# Patient Record
Sex: Male | Born: 1955 | Race: White | Hispanic: No | Smoking: Former smoker
Health system: Southern US, Community
[De-identification: ages and names within clinical notes are randomized; demographics above are authoritative.]

## PROBLEM LIST (undated history)

## (undated) DIAGNOSIS — G4730 Sleep apnea, unspecified: Secondary | ICD-10-CM

## (undated) DIAGNOSIS — I10 Essential (primary) hypertension: Secondary | ICD-10-CM

## (undated) DIAGNOSIS — E119 Type 2 diabetes mellitus without complications: Secondary | ICD-10-CM

## (undated) DIAGNOSIS — E7800 Pure hypercholesterolemia, unspecified: Secondary | ICD-10-CM

## (undated) HISTORY — PX: HAND SURGERY: SHX662

---

## 2004-06-08 ENCOUNTER — Encounter: Admission: RE | Admit: 2004-06-08 | Discharge: 2004-06-08 | Payer: Self-pay | Admitting: *Deleted

## 2004-06-10 ENCOUNTER — Ambulatory Visit (HOSPITAL_COMMUNITY): Admission: RE | Admit: 2004-06-10 | Discharge: 2004-06-10 | Payer: Self-pay | Admitting: *Deleted

## 2004-06-10 ENCOUNTER — Ambulatory Visit (HOSPITAL_BASED_OUTPATIENT_CLINIC_OR_DEPARTMENT_OTHER): Admission: RE | Admit: 2004-06-10 | Discharge: 2004-06-10 | Payer: Self-pay | Admitting: *Deleted

## 2005-06-07 ENCOUNTER — Ambulatory Visit: Payer: Self-pay | Admitting: Family Medicine

## 2005-06-28 ENCOUNTER — Ambulatory Visit: Payer: Self-pay | Admitting: Family Medicine

## 2005-07-19 ENCOUNTER — Encounter: Admission: RE | Admit: 2005-07-19 | Discharge: 2005-08-14 | Payer: Self-pay | Admitting: Family Medicine

## 2006-03-07 ENCOUNTER — Ambulatory Visit: Payer: Self-pay | Admitting: Family Medicine

## 2006-03-21 ENCOUNTER — Ambulatory Visit: Payer: Self-pay | Admitting: Family Medicine

## 2006-05-23 DIAGNOSIS — E669 Obesity, unspecified: Secondary | ICD-10-CM | POA: Insufficient documentation

## 2006-05-23 DIAGNOSIS — F5232 Male orgasmic disorder: Secondary | ICD-10-CM | POA: Insufficient documentation

## 2006-05-23 DIAGNOSIS — E119 Type 2 diabetes mellitus without complications: Secondary | ICD-10-CM | POA: Insufficient documentation

## 2006-05-23 DIAGNOSIS — E785 Hyperlipidemia, unspecified: Secondary | ICD-10-CM | POA: Insufficient documentation

## 2006-05-23 DIAGNOSIS — I10 Essential (primary) hypertension: Secondary | ICD-10-CM | POA: Insufficient documentation

## 2006-05-23 DIAGNOSIS — L219 Seborrheic dermatitis, unspecified: Secondary | ICD-10-CM | POA: Insufficient documentation

## 2006-05-23 DIAGNOSIS — G4730 Sleep apnea, unspecified: Secondary | ICD-10-CM | POA: Insufficient documentation

## 2006-05-30 ENCOUNTER — Ambulatory Visit: Payer: Self-pay | Admitting: Family Medicine

## 2006-08-25 ENCOUNTER — Encounter: Payer: Self-pay | Admitting: Family Medicine

## 2006-09-05 ENCOUNTER — Ambulatory Visit: Payer: Self-pay | Admitting: Family Medicine

## 2006-09-05 LAB — CONVERTED CEMR LAB
Hgb A1c MFr Bld: 6.3 %
Microalbumin U total vol: NORMAL mg/L

## 2006-10-04 ENCOUNTER — Telehealth: Payer: Self-pay | Admitting: Family Medicine

## 2007-01-02 ENCOUNTER — Ambulatory Visit: Payer: Self-pay | Admitting: Family Medicine

## 2007-01-02 LAB — CONVERTED CEMR LAB
Cholesterol, target level: 200 mg/dL
HDL goal, serum: 40 mg/dL
Hgb A1c MFr Bld: 6.5 %
LDL Goal: 100 mg/dL

## 2007-04-17 ENCOUNTER — Ambulatory Visit: Payer: Self-pay | Admitting: Family Medicine

## 2007-04-17 LAB — CONVERTED CEMR LAB
Blood Glucose, Fasting: 130 mg/dL
Hgb A1c MFr Bld: 6.6 %

## 2007-05-17 ENCOUNTER — Encounter: Payer: Self-pay | Admitting: Family Medicine

## 2007-05-17 LAB — CONVERTED CEMR LAB: PSA: 0.19 ng/mL (ref 0.10–4.00)

## 2007-05-18 ENCOUNTER — Encounter: Payer: Self-pay | Admitting: Family Medicine

## 2007-05-22 ENCOUNTER — Telehealth (INDEPENDENT_AMBULATORY_CARE_PROVIDER_SITE_OTHER): Payer: Self-pay | Admitting: *Deleted

## 2007-06-12 ENCOUNTER — Encounter: Payer: Self-pay | Admitting: Family Medicine

## 2007-06-14 ENCOUNTER — Ambulatory Visit: Payer: Self-pay | Admitting: Family Medicine

## 2008-04-29 ENCOUNTER — Ambulatory Visit: Payer: Self-pay | Admitting: Family Medicine

## 2008-04-29 LAB — CONVERTED CEMR LAB: Hgb A1c MFr Bld: 6.6 %

## 2008-11-11 ENCOUNTER — Ambulatory Visit: Payer: Self-pay | Admitting: Family Medicine

## 2008-11-11 DIAGNOSIS — L538 Other specified erythematous conditions: Secondary | ICD-10-CM | POA: Insufficient documentation

## 2008-11-12 LAB — CONVERTED CEMR LAB
ALT: 22 units/L (ref 0–53)
AST: 22 units/L (ref 0–37)
Albumin: 4.6 g/dL (ref 3.5–5.2)
Alkaline Phosphatase: 53 units/L (ref 39–117)
BUN: 16 mg/dL (ref 6–23)
CO2: 24 meq/L (ref 19–32)
Calcium: 9.6 mg/dL (ref 8.4–10.5)
Chloride: 100 meq/L (ref 96–112)
Cholesterol: 158 mg/dL (ref 0–200)
Creatinine, Ser: 0.92 mg/dL (ref 0.40–1.50)
Glucose, Bld: 125 mg/dL — ABNORMAL HIGH (ref 70–99)
HDL: 66 mg/dL (ref >39)
LDL Cholesterol: 72 mg/dL (ref 0–99)
PSA: 0.18 ng/mL (ref 0.10–4.00)
Potassium: 4.3 meq/L (ref 3.5–5.3)
Sodium: 138 meq/L (ref 135–145)
Total Bilirubin: 0.6 mg/dL (ref 0.3–1.2)
Total CHOL/HDL Ratio: 2.4
Total Protein: 7.7 g/dL (ref 6.0–8.3)
Triglycerides: 98 mg/dL (ref <150)
VLDL: 20 mg/dL (ref 0–40)

## 2008-12-02 ENCOUNTER — Ambulatory Visit: Payer: Self-pay | Admitting: Family Medicine

## 2009-02-10 ENCOUNTER — Ambulatory Visit: Payer: Self-pay | Admitting: Family Medicine

## 2009-02-10 DIAGNOSIS — R05 Cough: Secondary | ICD-10-CM | POA: Insufficient documentation

## 2009-02-10 DIAGNOSIS — F410 Panic disorder [episodic paroxysmal anxiety] without agoraphobia: Secondary | ICD-10-CM | POA: Insufficient documentation

## 2009-02-10 DIAGNOSIS — R059 Cough, unspecified: Secondary | ICD-10-CM | POA: Insufficient documentation

## 2009-03-16 ENCOUNTER — Telehealth (INDEPENDENT_AMBULATORY_CARE_PROVIDER_SITE_OTHER): Payer: Self-pay | Admitting: *Deleted

## 2009-03-17 ENCOUNTER — Ambulatory Visit (HOSPITAL_BASED_OUTPATIENT_CLINIC_OR_DEPARTMENT_OTHER): Admission: RE | Admit: 2009-03-17 | Discharge: 2009-03-17 | Payer: Self-pay | Admitting: Family Medicine

## 2009-03-17 ENCOUNTER — Ambulatory Visit: Payer: Self-pay | Admitting: Family Medicine

## 2009-03-17 ENCOUNTER — Ambulatory Visit: Payer: Self-pay | Admitting: Diagnostic Radiology

## 2009-03-17 DIAGNOSIS — L608 Other nail disorders: Secondary | ICD-10-CM | POA: Insufficient documentation

## 2009-03-17 LAB — CONVERTED CEMR LAB
Hgb A1c MFr Bld: 6.5 % — ABNORMAL HIGH (ref 4.6–6.1)
TSH: 4.797 microintl units/mL — ABNORMAL HIGH (ref 0.350–4.500)

## 2009-03-18 ENCOUNTER — Encounter: Payer: Self-pay | Admitting: Family Medicine

## 2009-03-18 LAB — CONVERTED CEMR LAB
Free T4: 0.93 ng/dL (ref 0.80–1.80)
T3, Free: 2.8 pg/mL (ref 2.3–4.2)

## 2009-03-19 DIAGNOSIS — E039 Hypothyroidism, unspecified: Secondary | ICD-10-CM | POA: Insufficient documentation

## 2009-05-26 ENCOUNTER — Ambulatory Visit: Payer: Self-pay | Admitting: Family Medicine

## 2009-05-26 DIAGNOSIS — F329 Major depressive disorder, single episode, unspecified: Secondary | ICD-10-CM | POA: Insufficient documentation

## 2009-05-26 DIAGNOSIS — J309 Allergic rhinitis, unspecified: Secondary | ICD-10-CM | POA: Insufficient documentation

## 2009-05-27 ENCOUNTER — Encounter: Payer: Self-pay | Admitting: Family Medicine

## 2009-05-27 LAB — CONVERTED CEMR LAB: TSH: 2.539 microintl units/mL (ref 0.350–4.500)

## 2009-06-25 ENCOUNTER — Telehealth (INDEPENDENT_AMBULATORY_CARE_PROVIDER_SITE_OTHER): Payer: Self-pay | Admitting: *Deleted

## 2009-09-22 ENCOUNTER — Ambulatory Visit: Payer: Self-pay | Admitting: Family Medicine

## 2009-09-22 DIAGNOSIS — J069 Acute upper respiratory infection, unspecified: Secondary | ICD-10-CM | POA: Insufficient documentation

## 2009-09-22 LAB — CONVERTED CEMR LAB
Albumin/Creatinine Ratio, Urine, POC: <30
Creatinine,U: 100 mg/dL
Hgb A1c MFr Bld: 6.9 %
Microalbumin U total vol: 10 mg/L

## 2009-10-13 ENCOUNTER — Telehealth: Payer: Self-pay | Admitting: Family Medicine

## 2009-10-15 ENCOUNTER — Encounter (INDEPENDENT_AMBULATORY_CARE_PROVIDER_SITE_OTHER): Payer: Self-pay | Admitting: *Deleted

## 2010-09-14 NOTE — Progress Notes (Signed)
Summary: Allergy referral  Phone Note Call from Patient   Caller: Patient Summary of Call: Pt would like referral to allergist in [place]. Pt said you have discussed this in the past. Please advise.  Initial call taken by: Payton Spark CMA,  October 13, 2009 2:44 PM  Follow-up for Phone Call        OK.  I will refer him to [facility]. Follow-up by: Seymour Bars DO,  October 13, 2009 3:01 PM

## 2010-09-14 NOTE — Letter (Signed)
Summary: Primary Care Consult Scheduled Letter  [place] [facility]  [address]   [place], [place] [address]   Phone: [phone]  Fax: [phone]      10/15/2009 MRN: [id number]  [facility] [address] [place], [place]  [address]    Dear Mr. GRANTZ,      We have scheduled an appointment for you.  At the recommendation of Dr.BOWEN , we have scheduled you a consult with Debara Pickett , DR [place] [facility]  on [facility]  at 8:45AM.  Their address is_[address],[place] . The office phone number is [phone].  If this appointment day and time is not convenient for you, please feel free to call the office of the doctor you are being referred to at the number listed above and reschedule the appointment.     It is important for you to keep your scheduled appointments. We are here to make sure you are given good patient care. If you have questions or you have made changes to your appointment, please notify us at  [phone], ask for HELEN.  NO ANTIHISTAMINE  3 DAYS PRIOR TO APPOINTMENT     Thank you,  Patient Care Coordinator [place] at [facility]

## 2010-09-14 NOTE — Assessment & Plan Note (Signed)
Summary: f/u DM   Vital Signs:  Patient profile:   55 year old male Height:      75.5 inches Weight:      293 pounds BMI:     36.27 O2 Sat:      97 % on Room air Temp:     97.5 degrees F oral Pulse rate:   85 / minute BP sitting:   143 / 90  (right arm) Cuff size:   large  Vitals Entered By: Payton Spark CMA/april (September 22, 2009 1:50 PM)  O2 Flow:  Room air CC: 3 month f/u Is Patient Diabetic? Yes   Primary Care Provider:  Seymour Bars D.O.  CC:  3 month f/u.  History of Present Illness: 55 yo WM presents for f/u T2DM.  Not checking home sugars.  A1C 6.9 today.  He agrees to call for his own diabetic eye exam.  On Metformin 500 mg two times a day.  Not exercising.  Gaining more wt.  Denies CP or DOE.    He is having some problems urinating.  He had gone to a  doctor in [place].  He was diagnosed with BPH and tried on Flomax but had low libido and ED problems.  He wants to f/u with Dr Isabel Caprice.  He has gained weight over the past few months.  He has cracked dry feet.  He saw dermatology and was given some cream.    He started to have a cold last Friday with head congestion, cough and sore throat.  No fevers or chills.  He has been taking OTC cold meds.    He has an itchy rash on the natal cleft x 1 wk.  No oozing or pain.  Not using anything.  Saw derm for dry cracked feet but RX cream is not helping.      Current Medications (verified): 1)  Metformin Hcl 500 Mg Tabs (Metformin Hcl) .Marland Kitchen.. 1 Tab By Mouth Bid 2)  Maxzide-25 37.5-25 Mg Tabs (Triamterene-Hctz) .Marland Kitchen.. 1 Tab By Mouth Daily 3)  Aspirin 81 Mg Tbec (Aspirin) .... Take 1 Tablet By Mouth Once A Day 4)  Simvastatin 40 Mg Tabs (Simvastatin) .Marland Kitchen.. 1 Tab By Mouth At Bedtime 5)  Levitra 20 Mg Tabs (Vardenafil Hcl) .Marland Kitchen.. 1 Tab By Mouth X 1 As Needed 6)  Fluticasone Propionate 50 Mcg/act Susp (Fluticasone Propionate) .... 2 Sprays Per Nostril Daily 7)  Omeprazole 40 Mg Cpdr (Omeprazole) .Marland Kitchen.. 1 Tab By Mouth Daily, Take 20  Min Before Breakfast 8)  Levothroid 50 Mcg Tabs (Levothyroxine Sodium) .Marland Kitchen.. 1 Tab By Mouth Daily 9)  Alprazolam 0.5 Mg Tabs (Alprazolam) .Marland Kitchen.. 1 Tab By Mouth Daily As Needed For Panic Attacks 10)  Nystatin-Triamcinolone 100000-0.1 Unit/gm-% Oint (Nystatin-Triamcinolone) .... Apply To Rash Two Times A Day X 10 Days 11)  Fluoxetine Hcl 20 Mg Tabs (Fluoxetine Hcl) .Marland Kitchen.. 1 Tab By Mouth Daily  Allergies (verified): 1)  ! Augmentin 2)  * Viagra  Past History:  Past Medical History: hx of neck fracture OSA - not using CPAP  premature ejaculation/ ED T2DM HTN obesity depression  Past Surgical History: Reviewed history from 09/05/2006 and no changes required. hand surgery  splenic artery stented  Social History: Reviewed history from 02/10/2009 and no changes required. 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    2 grown children, son lives at home.  Quit smoking in 2005.  Walks 30 min 2-3x wk.  Review of Systems General:  Complains of chills, fatigue, and  malaise; denies fever, loss of appetite, sweats, weakness, and weight loss. Eyes:  Denies blurring. ENT:  Complains of nasal congestion, postnasal drainage, and sore throat; denies sinus pressure. CV:  Denies chest pain or discomfort and shortness of breath with exertion. Resp:  Denies shortness of breath. GI:  Denies abdominal pain, diarrhea, nausea, and vomiting. GU:  Complains of erectile dysfunction. Derm:  Complains of dryness and rash.  Physical Exam  General:  obese WM in NAD Head:  normocephalic and atraumatic.   Eyes:  pupils equal, pupils round, and pupils reactive to light.   Nose:  nasal congestion present Mouth:  pharynx pink and moist.  throat injected Neck:  no masses.   Lungs:  Normal respiratory effort, chest expands symmetrically. Lungs are clear to auscultation, no crackles or wheezes. Heart:  Normal rate and regular rhythm. S1 and S2 normal without gallop, murmur, click, rub or other extra  sounds. Abdomen:  soft, non-tender, normal bowel sounds, no distention, no masses, and no guarding.  no AA bruits Pulses:  2+ pedal pulses Extremities:  no LE edema Skin:  dry cracked feet intertriginous rash in the natal cleft Cervical Nodes:  shotty anterior cervical chain LA Psych:  good eye contact and flat affect.    Diabetes Management Exam:    Foot Exam (with socks and/or shoes not present):       Sensory-Pinprick/Light touch:          Left medial foot (L-4): normal          Left dorsal foot (L-5): normal          Left lateral foot (S-1): normal          Right medial foot (L-4): normal          Right dorsal foot (L-5): normal          Right lateral foot (S-1): normal       Sensory-Monofilament:          Left foot: normal          Right foot: normal       Inspection:          Left foot: normal          Right foot: normal       Nails:          Left foot: normal          Right foot: normal   Impression & Recommendations:  Problem # 1:  DIABETES MELLITUS II, UNCOMPLICATED (ICD-250.00) A1C up to 6.9 from 6.5 with further wt gain.  Advised pt to start checking home sugars and work on exercise program. Increase Metformin to 850 mg two times a day.  Urine microalbumin UTD.  Monofilament normal.  RTC in 3 mos.   His updated medication list for this problem includes:    Metformin Hcl 850 Mg Tabs (Metformin hcl) .Marland Kitchen... 1 tab by mouth bid    Aspirin 81 Mg Tbec (Aspirin) .Marland Kitchen... Take 1 tablet by mouth once a day  Orders: Fingerstick (04540) Hemoglobin A1C (98119) Urine Microalbumin (14782)  Labs Reviewed: Creat: 0.92 (11/11/2008)   Microalbumin: 10 (09/22/2009) Reviewed HgBA1c results: 6.9 (09/22/2009)  6.5 (03/17/2009)  Problem # 2:  HYPERTENSION, BENIGN SYSTEMIC (ICD-401.1) BP high today during acute resp illness.  RFd meds.  Goal is <130/80. His updated medication list for this problem includes:    Maxzide-25 37.5-25 Mg Tabs (Triamterene-hctz) .Marland Kitchen... 1 tab by mouth  daily  BP today: 143/90 Prior BP: 139/92 (05/26/2009)  Prior 10 Yr Risk Heart Disease: Not enough information (09/05/2006)  Labs Reviewed: K+: 4.3 (11/11/2008) Creat: : 0.92 (11/11/2008)   Chol: 158 (11/11/2008)   HDL: 66 (11/11/2008)   LDL: 72 (11/11/2008)   TG: 98 (11/11/2008)  Problem # 3:  INTERTRIGO, CANDIDAL (ICD-695.89) Treat with Nystatin/Triamcinolone ointment x 10 days. Keep area clean and dry.    Problem # 4:  VIRAL URI (ICD-465.9) Abx Rx given to start over w/e if URI symptoms are at not improving.   His updated medication list for this problem includes:    Aspirin 81 Mg Tbec (Aspirin) .Marland Kitchen... Take 1 tablet by mouth once a day  Complete Medication List: 1)  Metformin Hcl 850 Mg Tabs (Metformin hcl) .Marland Kitchen.. 1 tab by mouth bid 2)  Maxzide-25 37.5-25 Mg Tabs (Triamterene-hctz) .Marland Kitchen.. 1 tab by mouth daily 3)  Aspirin 81 Mg Tbec (Aspirin) .... Take 1 tablet by mouth once a day 4)  Simvastatin 40 Mg Tabs (Simvastatin) .Marland Kitchen.. 1 tab by mouth at bedtime 5)  Levitra 20 Mg Tabs (Vardenafil hcl) .Marland Kitchen.. 1 tab by mouth x 1 as needed 6)  Fluticasone Propionate 50 Mcg/act Susp (Fluticasone propionate) .... 2 sprays per nostril daily 7)  Omeprazole 40 Mg Cpdr (Omeprazole) .Marland Kitchen.. 1 tab by mouth daily, take 20 min before breakfast 8)  Levothroid 50 Mcg Tabs (Levothyroxine sodium) .Marland Kitchen.. 1 tab by mouth daily 9)  Alprazolam 0.5 Mg Tabs (Alprazolam) .Marland Kitchen.. 1 tab by mouth daily as needed for panic attacks 10)  Nystatin-triamcinolone 100000-0.1 Unit/gm-% Oint (Nystatin-triamcinolone) .... Apply to rash two times a day x 10 days 11)  Fluoxetine Hcl 20 Mg Tabs (Fluoxetine hcl) .Marland Kitchen.. 1 tab by mouth daily 12)  Nystatin-triamcinolone 100000-0.1 Unit/gm-% Oint (Nystatin-triamcinolone) .... Apply to rash on buttocks two times a day x 10 days 13)  Zithromax Z-pak 250 Mg Tabs (Azithromycin) .... Take as directed  Patient Instructions: 1)  USE OTC foot cream (Dr Jari Sportsman) blue tube (with urea) at night. 2)  Sleep w/  cotton socks. 3)  Use Nystatin: Triamcinolone ointment on buttocks rash. 4)  Keep area clean and dry. 5)  After done w/ use, change to OTC Desitin (diaper rash ) cream. 6)  Increase Metformin to 850 mg two times a day. 7)  Work on diabetic diet, exercise, wt loss. 8)  Will get you back in w/ Dr Isabel Caprice. 9)  Take 5 days of Zithromax. 10)  F/U in 3 mos. Prescriptions: ZITHROMAX Z-PAK 250 MG TABS (AZITHROMYCIN) take as directed  #1 pack x 0   Entered and Authorized by:   Seymour Bars DO   Signed by:   Seymour Bars DO on 09/22/2009   Method used:   Electronically to        [facility] (retail)       [address]       [place], [place]  [address]       Ph: [phone]       Fax: [phone]   RxID:   [id number] NYSTATIN-TRIAMCINOLONE 100000-0.1 UNIT/GM-% OINT (NYSTATIN-TRIAMCINOLONE) apply to rash on buttocks two times a day x 10 days  #1 tube x 0   Entered and Authorized by:   Seymour Bars DO   Signed by:   Seymour Bars DO on 09/22/2009   Method used:   Electronically to        [facility] [address] [facility] (retail)       [address]  [place], [place]  [address]       Ph: [phone]       Fax: [phone]   RxID:   [id number] LEVOTHROID 50 MCG TABS (LEVOTHYROXINE SODIUM) 1 tab by mouth daily  #90 x 1   Entered and Authorized by:   Seymour Bars DO   Signed by:   Seymour Bars DO on 09/22/2009   Method used:   Electronically to        [facility]  [address] (retail)       [address]       [place], [place]  [address]       Ph: [phone]       Fax: [phone]   RxID:   [id number] METFORMIN HCL 850 MG TABS (METFORMIN HCL) 1 tab by mouth bid  #180 x 1   Entered and Authorized by:   Seymour Bars DO   Signed by:   Seymour Bars DO on 09/22/2009   Method used:   Electronically to        [facility]  [address] (retail)       [address]       [place], [place]  [address]       Ph: [phone]        Fax: [phone]   RxID:   [id number] MAXZIDE-25 37.5-25 MG TABS (TRIAMTERENE-HCTZ) 1 tab by mouth daily  #90 x 1   Entered and Authorized by:   Seymour Bars DO   Signed by:   Seymour Bars DO on 09/22/2009   Method used:   Electronically to        [facility]  [address] (retail)       [address]       [place], [place]  [address]       Ph: [phone]       Fax: [phone]   RxID:   [id number] FLUOXETINE HCL 20 MG TABS (FLUOXETINE HCL) 1 tab by mouth daily  #90 x 1   Entered and Authorized by:   Seymour Bars DO   Signed by:   Seymour Bars DO on 09/22/2009   Method used:   Electronically to        [facility]  [address] (retail)       [address]       [place], [place]  [address]       Ph: [phone]       Fax: [phone]   RxID:   [id number] SIMVASTATIN 40 MG TABS (SIMVASTATIN) 1 tab by mouth at bedtime  #90 Each x 1   Entered and Authorized by:   Seymour Bars DO   Signed by:   Seymour Bars DO on 09/22/2009   Method used:   Electronically to        [facility]  [address] (retail)       [address]       [place], [place]  [address]       Ph: [phone]       Fax: [phone]   RxID:   [id number]   Laboratory Results   Urine Tests    Microalbumin (urine): 10 mg/L Creatinine: 100mg /dL  A:C Ratio <70  Blood  Tests     HGBA1C: 6.9%   (Normal Range: Non-Diabetic - 3-6%   Control Diabetic - 6-8%)

## 2010-12-18 ENCOUNTER — Other Ambulatory Visit: Payer: Self-pay | Admitting: Family Medicine

## 2010-12-31 NOTE — Op Note (Signed)
NAMEREMIGIO, MCMILLON NO.:  [id number]   MEDICAL RECORD NO.:  [id number]          PATIENT TYPE:  AMB   LOCATION:  DSC                          FACILITY:  [facility]   PHYSICIAN:  Tennis Must Meyerdierks, M.D.DATE OF BIRTH:  11/29/1955   DATE OF PROCEDURE:  06/10/2004  DATE OF DISCHARGE:                                 OPERATIVE REPORT   PREOPERATIVE DIAGNOSIS:  Malunion, fracture-dislocation, left index  metacarpophalangeal joint.   POSTOPERATIVE DIAGNOSIS:  Malunion, fracture-dislocation, left index  metacarpophalangeal joint.   PROCEDURE:  Open reduction and internal fixation, metacarpophalangeal joint,  left index finger.   SURGEON:  Lowell Bouton, M.D.   ANESTHESIA:  IV regional.   OPERATIVE FINDINGS:  The patient had a comminuted intra-articular fracture  of the proximal phalanx of the left index finger.  The joint was subluxed  radially.  There was an articular defect at the joint that measured about 5  mm in width.  The cartilage on the metacarpal head was relatively smooth.   PROCEDURE:  Under IV regional anesthesia, the left hand was prepped and  draped in the usual fashion and a longitudinal incision was made across the  dorsum of the left index MP joint.  Sharp dissection was carried through the  subcutaneous tissues.  Bleeding points were coagulated.  Sharp dissection  was then carried down through the ulnar sagittal bands, retracting the  extensor tendons radially.  The periosteum was incised longitudinally and  the capsule was opened dorsally.  A Freer elevator was inserted in the joint  and the fracture site was identified.  The ulnar collateral ligament had to  be released at its insertion on the proximal phalanx to allow visualization  of the fracture, which was ulnar and volar.  The fracture was then freed up  with an elevator and there was a defect of cartilage that measured about 5  mm.  After freeing up the fragment and using a  Freer elevator in the pouch  of the volar plate to free up any adhesions, the joint was reduced.  It was  very unstable.  At this point after taking the ulnar fragment down, a 0.045  K-wire was placed retrograde across the proximal phalanx and then back into  the ulnar fragment.  This was very unstable, and the fragment was quite  small.  At this point it was determined that reducing the joint and pinning  it in flexion would give the best chance to have a stable joint down the  road.  There was no way to reconstruct the articular surface.  The joint was  then reduced and flexed down to 70 degrees and a 0.045 K-wire was placed  across the metacarpal neck and into the proximal phalanx.  A second K-wire  was placed adjacent to the first and x-rays showed good reduction of the  joint surface.  The K-wires were bent over and left protruding from the  skin.  The wound was then irrigated with saline, 0.5% was inserted in the  skin edges for pain control, and a digital block was  performed.  The ulnar  collateral ligament was repaired with 4-0 Mersilene sutures, and the  extensor mechanism was also closed down ulnarly with 4-0 Mersilene sutures.  The pins were relieved sharply and  the skin was closed with 4-0 nylon suture.  Sterile dressings were applied,  followed by a dorsal protective splint.  The tourniquet was released with  good circulation to the hand.  The patient went to the recovery room awake  and stable, in good condition.       EMM/MEDQ  D:  06/10/2004  T:  06/10/2004  Job:  [id number]

## 2011-08-23 ENCOUNTER — Ambulatory Visit (INDEPENDENT_AMBULATORY_CARE_PROVIDER_SITE_OTHER): Payer: BC Managed Care – PPO | Admitting: Urology

## 2011-08-23 DIAGNOSIS — N529 Male erectile dysfunction, unspecified: Secondary | ICD-10-CM

## 2011-08-23 DIAGNOSIS — N320 Bladder-neck obstruction: Secondary | ICD-10-CM

## 2011-08-23 DIAGNOSIS — R6882 Decreased libido: Secondary | ICD-10-CM

## 2011-08-23 DIAGNOSIS — N35919 Unspecified urethral stricture, male, unspecified site: Secondary | ICD-10-CM

## 2015-08-10 DIAGNOSIS — D126 Benign neoplasm of colon, unspecified: Secondary | ICD-10-CM | POA: Insufficient documentation

## 2015-12-17 ENCOUNTER — Emergency Department (INDEPENDENT_AMBULATORY_CARE_PROVIDER_SITE_OTHER): Payer: BLUE CROSS/BLUE SHIELD

## 2015-12-17 ENCOUNTER — Emergency Department (INDEPENDENT_AMBULATORY_CARE_PROVIDER_SITE_OTHER)
Admission: EM | Admit: 2015-12-17 | Discharge: 2015-12-17 | Disposition: A | Discharge status: Home / Self Care | Payer: BLUE CROSS/BLUE SHIELD | Source: Home / Self Care | Attending: Family Medicine | Admitting: Family Medicine

## 2015-12-17 DIAGNOSIS — M5136 Other intervertebral disc degeneration, lumbar region: Secondary | ICD-10-CM

## 2015-12-17 DIAGNOSIS — M5417 Radiculopathy, lumbosacral region: Secondary | ICD-10-CM

## 2015-12-17 DIAGNOSIS — M5416 Radiculopathy, lumbar region: Secondary | ICD-10-CM

## 2015-12-17 HISTORY — DX: Essential (primary) hypertension: I10

## 2015-12-17 HISTORY — DX: Pure hypercholesterolemia, unspecified: E78.00

## 2015-12-17 HISTORY — DX: Type 2 diabetes mellitus without complications: E11.9

## 2015-12-17 MED ORDER — HYDROCODONE-ACETAMINOPHEN 5-325 MG PO TABS: ORAL_TABLET | ORAL | Status: DC

## 2015-12-17 MED ORDER — PREDNISONE 20 MG PO TABS: ORAL_TABLET | ORAL | Status: DC

## 2015-12-17 NOTE — Discharge Instructions (Signed)
Apply ice pack for 20 to 30 minutes, 3 to 4 times daily  Continue until pain decreases.  Begin back stretching and range of motion exercises as tolerated.   Lumbosacral Radiculopathy Lumbosacral radiculopathy is a condition that involves the spinal nerves and nerve roots in the low back and bottom of the spine. The condition develops when these nerves and nerve roots move out of place or become inflamed and cause symptoms. CAUSES This condition may be caused by:  Pressure from a disk that bulges out of place (herniated disk). A disk is a plate of cartilage that separates bones in the spine.  Disk degeneration.  A narrowing of the bones of the lower back (spinal stenosis).  A tumor.  An infection.  An injury that places sudden pressure on the disks that cushion the bones of your lower spine. RISK FACTORS This condition is more likely to develop in:  Males aged 30-50 years.  Females aged 67-60 years.  People who lift improperly.  People who are overweight or live a sedentary lifestyle.  People who smoke.  People who perform repetitive activities that strain the spine. SYMPTOMS Symptoms of this condition include:  Pain that goes down from the back into the legs (sciatica). This is the most common symptom. The pain may be worse with sitting, coughing, or sneezing.  Pain and numbness in the arms and legs.  Muscle weakness.  Tingling.  Loss of bladder control or bowel control. DIAGNOSIS This condition is diagnosed with a physical exam and medical history. If the pain is lasting, you may have tests, such as:  MRI scan.  X-ray.  CT scan.  Myelogram.  Nerve conduction study. TREATMENT This condition is often treated with:  Ice packs applied to affected areas.  Stretches to improve flexibility.  Exercises to strengthen back muscles.  Physical therapy.  Pain medicine.  A steroid injection in the spine. In some cases, no treatment is needed. If the condition  is long-lasting (chronic), or if symptoms are severe, treatment may involve surgery or lifestyle changes, such as following a weight loss plan. HOME CARE INSTRUCTIONS Medicines  Take medicines only as directed by your health care provider.  Do not drive or operate heavy machinery while taking pain medicine. Injury Care   Apply ice to the affected area:  Put ice in a plastic bag.  Place a towel between your skin and the bag.  Leave the ice on for 20-30 minutes, every 2 hours while you are awake or as needed. Or, leave the ice on for as long as directed by your health care provider. Other Instructions  If you were shown how to do any exercises or stretches, do them as directed by your health care provider.  If your health care provider prescribed a diet or exercise program, follow it as directed.  Keep all follow-up visits as directed by your health care provider. This is important. SEEK MEDICAL CARE IF:  Your pain does not improve over time even when taking pain medicines. SEEK IMMEDIATE MEDICAL CARE IF:  Your develop severe pain.  Your pain suddenly gets worse.  You develop increasing weakness in your legs.  You lose the ability to control your bladder or bowel.  You have difficulty walking or balancing.  You have a fever.   This information is not intended to replace advice given to you by your health care provider. Make sure you discuss any questions you have with your health care provider.   Document Released: 08/01/2005 Document  Revised: 12/16/2014 Document Reviewed: 07/28/2014 Elsevier Interactive Patient Education [employer].

## 2015-12-17 NOTE — ED Notes (Signed)
About 10 days ago, bent to pick a box up at work and felt a twinge in lower back.  Has taken ibuprofen and heat and ice, not helping much.  Today complaining of lower back pain that goes down in to left hip and to the top of knee.  Denies numbness and tingling.

## 2015-12-17 NOTE — ED Provider Notes (Signed)
CSN: [id number]     Arrival date & time 12/17/15  1458 History   First MD Initiated Contact with Patient 12/17/15 1531     Chief Complaint  Patient presents with  . Back Pain      HPI Comments: While picking up a box 1.5 weeks ago, patient felt a sharp pain in his left lower back.  The pain has persisted, and yesterday began to radiate into his left posterior thigh to the top of his left knee.   He denies bowel or bladder dysfunction, and no saddle numbness.    Patient is a 60 y.o. male presenting with back pain. The history is provided by the patient.  Back Pain Location:  Lumbar spine and gluteal region Quality:  Aching Radiates to:  L posterior upper leg Pain severity:  Moderate Pain is:  Same all the time Onset quality:  Gradual Duration:  10 days Timing:  Constant Progression:  Worsening Chronicity:  New Context: lifting heavy objects   Relieved by:  Nothing Worsened by:  Movement Ineffective treatments:  NSAIDs Associated symptoms: no abdominal pain, no bladder incontinence, no bowel incontinence, no dysuria, no fever, no leg pain, no numbness, no paresthesias, no pelvic pain, no perianal numbness, no tingling and no weakness   Risk factors: obesity     Past Medical History  Diagnosis Date  . Diabetes mellitus without complication ([place])   . Hypertension   . High cholesterol    Past Surgical History  Procedure Laterality Date  . Hand surgery     History reviewed. No pertinent family history. Social History  Substance Use Topics  . Smoking status: Never Smoker   . Smokeless tobacco: Never Used  . Alcohol Use: Yes     Comment: 10 per week    Review of Systems  Constitutional: Negative for fever.  Gastrointestinal: Negative for abdominal pain and bowel incontinence.  Genitourinary: Negative for bladder incontinence, dysuria and pelvic pain.  Musculoskeletal: Positive for back pain.  Neurological: Negative for tingling, weakness, numbness and paresthesias.  All  other systems reviewed and are negative.   Allergies  Amoxicillin-pot clavulanate and Sildenafil  Home Medications   Prior to Admission medications   Medication Sig Start Date End Date Taking? Authorizing Provider  lisinopril (PRINIVIL,ZESTRIL) 20 MG tablet Take 20 mg by mouth daily.   Yes Historical Provider, MD  metFORMIN (GLUCOPHAGE) 1000 MG tablet Take 1,000 mg by mouth 2 (two) times daily with a meal.   Yes Historical Provider, MD  HYDROcodone-acetaminophen (NORCO/VICODIN) 5-325 MG tablet Take one by mouth at bedtime as needed for pain 12/17/15   Kandra Nicolas, MD  LEVITRA 20 MG tablet TAKE ONE TABLET BY MOUTH EVERY DAY AS NEEDED 12/18/10   Hali Marry, MD  predniSONE (DELTASONE) 20 MG tablet Take one tab by mouth twice daily for 5 days, then one daily. Take with food. 12/17/15   Kandra Nicolas, MD   Meds Ordered and Administered this Visit  Medications - No data to display  BP 138/82 mmHg  Pulse 92  Temp(Src) 98.2 F (36.8 C) (Oral)  Ht 6\' 2"  (1.88 m)  Wt 309 lb (140.161 kg)  BMI 39.66 kg/m2  SpO2 95% No data found.   Physical Exam  Constitutional: He is oriented to person, place, and time. He appears well-developed and well-nourished. No distress.  Patient is obese (BMI 39.7)  HENT:  Head: Normocephalic.  Eyes: Conjunctivae are normal. Pupils are equal, round, and reactive to light.  Neck: Normal range  of motion.  Cardiovascular: Normal heart sounds.   Pulmonary/Chest: Breath sounds normal.  Abdominal: There is no tenderness.  Musculoskeletal: He exhibits no edema.       Back:       Legs: Back:  Range of motion relatively well preserved.  Can heel/toe walk and squat without difficulty.    Tenderness in the bilateral paraspinous muscles as noted on diagram in blue.    Straight leg raising test is negative.  Sitting knee extension test is negative.  Strength and sensation in the lower extremities is normal.  Patellar and achilles reflexes are normal    Area  of radiated pain left posterior thigh outlined in red.  Neurological: He is alert and oriented to person, place, and time.  Skin: Skin is warm and dry. No rash noted.  Nursing note and vitals reviewed.   ED Course  Procedures  None  Imaging Review Dg Lumbar Spine Complete  12/17/2015  CLINICAL DATA:  Low back and left hip pain for 2 weeks after lifting. EXAM: LUMBAR SPINE - COMPLETE 4+ VIEW COMPARISON:  None. FINDINGS: No fracture or spondylolisthesis is noted. Minimal degenerative disc disease is noted at L3-4. Posterior facet joints appear normal. Atherosclerosis of abdominal aorta is noted. IMPRESSION: Minimal degenerative disc disease is noted at L3-4. No acute abnormality seen in the lumbar spine. Electronically Signed   By: Marijo Conception, M.D.   On: 12/17/2015 16:14      MDM   1. Lumbar back pain with radiculopathy affecting left lower extremity    Begin prednisone burst/taper.  Rx for Lortab at bedtime. Apply ice pack for 20 to 30 minutes, 3 to 4 times daily  Continue until pain decreases.  Begin back stretching and range of motion exercises as tolerated. Followup with Dr. Aundria Mems or Dr. Lynne Leader ([facility]) if not improving about two weeks.     Kandra Nicolas, MD 12/23/15 2217

## 2016-01-05 DIAGNOSIS — M5416 Radiculopathy, lumbar region: Secondary | ICD-10-CM | POA: Diagnosis not present

## 2016-01-05 DIAGNOSIS — Z6839 Body mass index (BMI) 39.0-39.9, adult: Secondary | ICD-10-CM | POA: Diagnosis not present

## 2016-01-06 DIAGNOSIS — E119 Type 2 diabetes mellitus without complications: Secondary | ICD-10-CM | POA: Diagnosis not present

## 2016-01-06 DIAGNOSIS — E785 Hyperlipidemia, unspecified: Secondary | ICD-10-CM | POA: Diagnosis not present

## 2016-01-06 DIAGNOSIS — F410 Panic disorder [episodic paroxysmal anxiety] without agoraphobia: Secondary | ICD-10-CM | POA: Diagnosis not present

## 2016-01-06 DIAGNOSIS — Z79899 Other long term (current) drug therapy: Secondary | ICD-10-CM | POA: Diagnosis not present

## 2016-01-06 DIAGNOSIS — Z125 Encounter for screening for malignant neoplasm of prostate: Secondary | ICD-10-CM | POA: Diagnosis not present

## 2016-01-06 DIAGNOSIS — I10 Essential (primary) hypertension: Secondary | ICD-10-CM | POA: Diagnosis not present

## 2016-01-12 DIAGNOSIS — Z6839 Body mass index (BMI) 39.0-39.9, adult: Secondary | ICD-10-CM | POA: Diagnosis not present

## 2016-01-12 DIAGNOSIS — Z0001 Encounter for general adult medical examination with abnormal findings: Secondary | ICD-10-CM | POA: Diagnosis not present

## 2016-01-12 DIAGNOSIS — E119 Type 2 diabetes mellitus without complications: Secondary | ICD-10-CM | POA: Diagnosis not present

## 2016-01-12 DIAGNOSIS — E785 Hyperlipidemia, unspecified: Secondary | ICD-10-CM | POA: Diagnosis not present

## 2016-01-19 DIAGNOSIS — M5432 Sciatica, left side: Secondary | ICD-10-CM | POA: Diagnosis not present

## 2016-01-19 DIAGNOSIS — M9903 Segmental and somatic dysfunction of lumbar region: Secondary | ICD-10-CM | POA: Diagnosis not present

## 2016-01-26 DIAGNOSIS — M5432 Sciatica, left side: Secondary | ICD-10-CM | POA: Diagnosis not present

## 2016-01-26 DIAGNOSIS — M9903 Segmental and somatic dysfunction of lumbar region: Secondary | ICD-10-CM | POA: Diagnosis not present

## 2016-01-27 DIAGNOSIS — M9903 Segmental and somatic dysfunction of lumbar region: Secondary | ICD-10-CM | POA: Diagnosis not present

## 2016-01-27 DIAGNOSIS — M5432 Sciatica, left side: Secondary | ICD-10-CM | POA: Diagnosis not present

## 2016-01-28 DIAGNOSIS — M9903 Segmental and somatic dysfunction of lumbar region: Secondary | ICD-10-CM | POA: Diagnosis not present

## 2016-01-28 DIAGNOSIS — M5432 Sciatica, left side: Secondary | ICD-10-CM | POA: Diagnosis not present

## 2016-02-04 DIAGNOSIS — M5432 Sciatica, left side: Secondary | ICD-10-CM | POA: Diagnosis not present

## 2016-02-04 DIAGNOSIS — M9903 Segmental and somatic dysfunction of lumbar region: Secondary | ICD-10-CM | POA: Diagnosis not present

## 2016-02-09 DIAGNOSIS — M9903 Segmental and somatic dysfunction of lumbar region: Secondary | ICD-10-CM | POA: Diagnosis not present

## 2016-02-09 DIAGNOSIS — M5432 Sciatica, left side: Secondary | ICD-10-CM | POA: Diagnosis not present

## 2016-02-11 DIAGNOSIS — M5432 Sciatica, left side: Secondary | ICD-10-CM | POA: Diagnosis not present

## 2016-02-11 DIAGNOSIS — M9903 Segmental and somatic dysfunction of lumbar region: Secondary | ICD-10-CM | POA: Diagnosis not present

## 2016-02-17 DIAGNOSIS — M5432 Sciatica, left side: Secondary | ICD-10-CM | POA: Diagnosis not present

## 2016-02-17 DIAGNOSIS — M9903 Segmental and somatic dysfunction of lumbar region: Secondary | ICD-10-CM | POA: Diagnosis not present

## 2016-02-18 DIAGNOSIS — M5432 Sciatica, left side: Secondary | ICD-10-CM | POA: Diagnosis not present

## 2016-02-18 DIAGNOSIS — M9903 Segmental and somatic dysfunction of lumbar region: Secondary | ICD-10-CM | POA: Diagnosis not present

## 2016-02-23 DIAGNOSIS — M5432 Sciatica, left side: Secondary | ICD-10-CM | POA: Diagnosis not present

## 2016-02-23 DIAGNOSIS — M9903 Segmental and somatic dysfunction of lumbar region: Secondary | ICD-10-CM | POA: Diagnosis not present

## 2016-02-25 DIAGNOSIS — M5432 Sciatica, left side: Secondary | ICD-10-CM | POA: Diagnosis not present

## 2016-02-25 DIAGNOSIS — M9903 Segmental and somatic dysfunction of lumbar region: Secondary | ICD-10-CM | POA: Diagnosis not present

## 2016-03-01 DIAGNOSIS — M5432 Sciatica, left side: Secondary | ICD-10-CM | POA: Diagnosis not present

## 2016-03-01 DIAGNOSIS — M9903 Segmental and somatic dysfunction of lumbar region: Secondary | ICD-10-CM | POA: Diagnosis not present

## 2016-03-22 DIAGNOSIS — M9903 Segmental and somatic dysfunction of lumbar region: Secondary | ICD-10-CM | POA: Diagnosis not present

## 2016-03-22 DIAGNOSIS — M5432 Sciatica, left side: Secondary | ICD-10-CM | POA: Diagnosis not present

## 2016-04-12 DIAGNOSIS — M9903 Segmental and somatic dysfunction of lumbar region: Secondary | ICD-10-CM | POA: Diagnosis not present

## 2016-04-12 DIAGNOSIS — M5432 Sciatica, left side: Secondary | ICD-10-CM | POA: Diagnosis not present

## 2016-04-14 DIAGNOSIS — E119 Type 2 diabetes mellitus without complications: Secondary | ICD-10-CM | POA: Diagnosis not present

## 2016-05-12 DIAGNOSIS — M9903 Segmental and somatic dysfunction of lumbar region: Secondary | ICD-10-CM | POA: Diagnosis not present

## 2016-05-12 DIAGNOSIS — M5432 Sciatica, left side: Secondary | ICD-10-CM | POA: Diagnosis not present

## 2016-05-26 DIAGNOSIS — Z23 Encounter for immunization: Secondary | ICD-10-CM | POA: Diagnosis not present

## 2016-05-26 DIAGNOSIS — I10 Essential (primary) hypertension: Secondary | ICD-10-CM | POA: Diagnosis not present

## 2016-05-26 DIAGNOSIS — E119 Type 2 diabetes mellitus without complications: Secondary | ICD-10-CM | POA: Diagnosis not present

## 2016-06-14 DIAGNOSIS — M9903 Segmental and somatic dysfunction of lumbar region: Secondary | ICD-10-CM | POA: Diagnosis not present

## 2016-06-14 DIAGNOSIS — M5432 Sciatica, left side: Secondary | ICD-10-CM | POA: Diagnosis not present

## 2016-07-06 DIAGNOSIS — M5432 Sciatica, left side: Secondary | ICD-10-CM | POA: Diagnosis not present

## 2016-07-06 DIAGNOSIS — M9903 Segmental and somatic dysfunction of lumbar region: Secondary | ICD-10-CM | POA: Diagnosis not present

## 2016-07-19 DIAGNOSIS — M5432 Sciatica, left side: Secondary | ICD-10-CM | POA: Diagnosis not present

## 2016-07-19 DIAGNOSIS — M9903 Segmental and somatic dysfunction of lumbar region: Secondary | ICD-10-CM | POA: Diagnosis not present

## 2016-07-21 ENCOUNTER — Observation Stay (HOSPITAL_COMMUNITY)
Admission: EM | Admit: 2016-07-21 | Discharge: 2016-07-22 | Disposition: A | Discharge status: Home / Self Care | Payer: BLUE CROSS/BLUE SHIELD | Attending: Internal Medicine | Admitting: Internal Medicine

## 2016-07-21 ENCOUNTER — Emergency Department (HOSPITAL_COMMUNITY): Payer: BLUE CROSS/BLUE SHIELD

## 2016-07-21 ENCOUNTER — Encounter (HOSPITAL_COMMUNITY): Payer: Self-pay | Admitting: Emergency Medicine

## 2016-07-21 DIAGNOSIS — Y999 Unspecified external cause status: Secondary | ICD-10-CM | POA: Insufficient documentation

## 2016-07-21 DIAGNOSIS — Z885 Allergy status to narcotic agent status: Secondary | ICD-10-CM | POA: Diagnosis not present

## 2016-07-21 DIAGNOSIS — Z6837 Body mass index (BMI) 37.0-37.9, adult: Secondary | ICD-10-CM | POA: Insufficient documentation

## 2016-07-21 DIAGNOSIS — G4730 Sleep apnea, unspecified: Secondary | ICD-10-CM | POA: Diagnosis present

## 2016-07-21 DIAGNOSIS — T50905A Adverse effect of unspecified drugs, medicaments and biological substances, initial encounter: Secondary | ICD-10-CM | POA: Diagnosis not present

## 2016-07-21 DIAGNOSIS — E7800 Pure hypercholesterolemia, unspecified: Secondary | ICD-10-CM | POA: Diagnosis not present

## 2016-07-21 DIAGNOSIS — E785 Hyperlipidemia, unspecified: Secondary | ICD-10-CM | POA: Diagnosis not present

## 2016-07-21 DIAGNOSIS — T7840XA Allergy, unspecified, initial encounter: Secondary | ICD-10-CM | POA: Diagnosis present

## 2016-07-21 DIAGNOSIS — R221 Localized swelling, mass and lump, neck: Secondary | ICD-10-CM | POA: Diagnosis not present

## 2016-07-21 DIAGNOSIS — R609 Edema, unspecified: Secondary | ICD-10-CM | POA: Diagnosis not present

## 2016-07-21 DIAGNOSIS — Z7982 Long term (current) use of aspirin: Secondary | ICD-10-CM | POA: Insufficient documentation

## 2016-07-21 DIAGNOSIS — Z880 Allergy status to penicillin: Secondary | ICD-10-CM | POA: Diagnosis not present

## 2016-07-21 DIAGNOSIS — Z7984 Long term (current) use of oral hypoglycemic drugs: Secondary | ICD-10-CM | POA: Insufficient documentation

## 2016-07-21 DIAGNOSIS — E039 Hypothyroidism, unspecified: Secondary | ICD-10-CM | POA: Insufficient documentation

## 2016-07-21 DIAGNOSIS — T783XXA Angioneurotic edema, initial encounter: Secondary | ICD-10-CM | POA: Diagnosis not present

## 2016-07-21 DIAGNOSIS — E119 Type 2 diabetes mellitus without complications: Secondary | ICD-10-CM | POA: Diagnosis not present

## 2016-07-21 DIAGNOSIS — R0682 Tachypnea, not elsewhere classified: Secondary | ICD-10-CM | POA: Diagnosis not present

## 2016-07-21 DIAGNOSIS — I10 Essential (primary) hypertension: Secondary | ICD-10-CM | POA: Insufficient documentation

## 2016-07-21 HISTORY — DX: Sleep apnea, unspecified: G47.30

## 2016-07-21 LAB — CBC WITH DIFFERENTIAL/PLATELET
Basophils Absolute: 0 10*3/uL (ref 0.0–0.1)
Basophils Relative: 0 %
Eosinophils Absolute: 0.2 10*3/uL (ref 0.0–0.7)
Eosinophils Relative: 2 %
HCT: 42.6 % (ref 39.0–52.0)
Hemoglobin: 14.6 g/dL (ref 13.0–17.0)
Lymphocytes Relative: 18 %
Lymphs Abs: 1.6 10*3/uL (ref 0.7–4.0)
MCH: 33.1 pg (ref 26.0–34.0)
MCHC: 34.3 g/dL (ref 30.0–36.0)
MCV: 96.6 fL (ref 78.0–100.0)
Monocytes Absolute: 0.9 10*3/uL (ref 0.1–1.0)
Monocytes Relative: 10 %
Neutro Abs: 6.1 10*3/uL (ref 1.7–7.7)
Neutrophils Relative %: 70 %
Platelets: 208 10*3/uL (ref 150–400)
RBC: 4.41 MIL/uL (ref 4.22–5.81)
RDW: 12.6 % (ref 11.5–15.5)
WBC: 8.7 10*3/uL (ref 4.0–10.5)

## 2016-07-21 LAB — BASIC METABOLIC PANEL
Anion gap: 8 (ref 5–15)
BUN: 18 mg/dL (ref 6–20)
CO2: 30 mmol/L (ref 22–32)
Calcium: 10 mg/dL (ref 8.9–10.3)
Chloride: 97 mmol/L — ABNORMAL LOW (ref 101–111)
Creatinine, Ser: 1.26 mg/dL — ABNORMAL HIGH (ref 0.61–1.24)
GFR calc Af Amer: >60 mL/min (ref >60)
GFR calc non Af Amer: 60 mL/min — ABNORMAL LOW (ref >60)
Glucose, Bld: 270 mg/dL — ABNORMAL HIGH (ref 65–99)
Potassium: 3.8 mmol/L (ref 3.5–5.1)
Sodium: 135 mmol/L (ref 135–145)

## 2016-07-21 LAB — LACTIC ACID, PLASMA
Lactic Acid, Venous: 2.2 mmol/L (ref 0.5–1.9)
Lactic Acid, Venous: 1.9 mmol/L (ref 0.5–1.9)

## 2016-07-21 MED ORDER — IOPAMIDOL (ISOVUE-300) INJECTION 61%
75.0000 mL | Freq: Once | INTRAVENOUS | Status: AC | PRN
  Administered 2016-07-21: 75 mL via INTRAVENOUS

## 2016-07-21 MED ORDER — METHYLPREDNISOLONE SODIUM SUCC 125 MG IJ SOLR
125.0000 mg | Freq: Once | INTRAMUSCULAR | Status: AC
  Administered 2016-07-21: 125 mg via INTRAVENOUS
  Filled 2016-07-21: qty 2

## 2016-07-21 MED ORDER — FAMOTIDINE IN NACL 20-0.9 MG/50ML-% IV SOLN
20.0000 mg | Freq: Once | INTRAVENOUS | Status: AC
  Administered 2016-07-21: 20 mg via INTRAVENOUS
  Filled 2016-07-21: qty 50

## 2016-07-21 MED ORDER — DIPHENHYDRAMINE HCL 50 MG/ML IJ SOLN
25.0000 mg | Freq: Once | INTRAMUSCULAR | Status: AC
  Administered 2016-07-21: 25 mg via INTRAVENOUS
  Filled 2016-07-21: qty 1

## 2016-07-21 MED ORDER — SODIUM CHLORIDE 0.9 % IV BOLUS (SEPSIS)
1000.0000 mL | Freq: Once | INTRAVENOUS | Status: AC
  Administered 2016-07-21: 1000 mL via INTRAVENOUS

## 2016-07-21 MED ORDER — SODIUM CHLORIDE 0.9 % IV SOLN
10.0000 mL/h | Freq: Once | INTRAVENOUS | Status: AC
  Administered 2016-07-21: 10 mL/h via INTRAVENOUS

## 2016-07-21 NOTE — ED Notes (Signed)
CRITICAL VALUE ALERT  Critical value received:  Lactic Acid - 2.2  Date of notification:  07/21/2016  Time of notification:  1914  Critical value read back: yes  Nurse who received alert:  LJS  MD notified (1st page):  Dr Thurnell Garbe  Time of first page:  1915  MD notified (2nd page):  Time of second page:  Responding MD:  Dr Thurnell Garbe  Time MD responded:  [phone]

## 2016-07-21 NOTE — ED Triage Notes (Signed)
Pt reports sudden onset of swelling in his neck that started approx 2 hours ago. Pt denies swelling in his tongue. Pt reports pain with swallowing but no difficulty swallowing. Pt denies difficulty breathing. Pt takes lisinopril.

## 2016-07-21 NOTE — ED Provider Notes (Signed)
[facility] DEPT Provider Note   CSN: [id number] Arrival date & time: 07/21/16  1611     History   Chief Complaint Chief Complaint  Patient presents with  . Neck swelling    HPI Jonathon Lin is a 60 y.o. male.  HPI  Pt was seen at 1650. Per pt, c/o gradual onset and persistence of constant left sided neck "swelling" that began approximately 2 hours PTA. Pt states his symptoms began approximately 30 minutes after eating. Denies intra-oral edema, no choking, no N/V, no teeth pain, no dysphagia, no sore throat, no SOB/wheezing, no rash.    Past Medical History:  Diagnosis Date  . Diabetes mellitus without complication ([place])   . High cholesterol   . Hypertension     Patient Active Problem List   Diagnosis Date Noted  . VIRAL URI 09/22/2009  . DEPRESSION 05/26/2009  . ALLERGIC RHINITIS 05/26/2009  . HYPOTHYROIDISM, BORDERLINE 03/19/2009  . OTHER SPECIFIED DISEASE OF NAIL 03/17/2009  . PANIC ATTACK 02/10/2009  . COUGH 02/10/2009  . INTERTRIGO, CANDIDAL 11/11/2008  . DIABETES MELLITUS II, UNCOMPLICATED [id number]  . HYPERLIPIDEMIA 05/23/2006  . OBESITY, NOS 05/23/2006  . ERECTILE DYSFUNCTION 05/23/2006  . HYPERTENSION, BENIGN SYSTEMIC 05/23/2006  . SEBORRHEIC DERMATITIS, NOS 05/23/2006  . SLEEP APNEA 05/23/2006    Past Surgical History:  Procedure Laterality Date  . HAND SURGERY         Home Medications    Prior to Admission medications   Medication Sig Start Date End Date Taking? Authorizing Provider  aspirin EC 81 MG tablet Take 81 mg by mouth daily.   Yes Historical Provider, MD  glimepiride (AMARYL) 2 MG tablet Take 1 tablet by mouth daily. 06/16/16  Yes Historical Provider, MD  ibuprofen (ADVIL,MOTRIN) 200 MG tablet Take 600 mg by mouth every 6 (six) hours as needed for moderate pain.   Yes Historical Provider, MD  LEVITRA 20 MG tablet TAKE ONE TABLET BY MOUTH EVERY DAY AS NEEDED 12/18/10  Yes Hali Marry, MD  lisinopril (PRINIVIL,ZESTRIL) 10 MG  tablet Take 1 tablet by mouth daily. 07/12/16  Yes Historical Provider, MD  metFORMIN (GLUCOPHAGE) 1000 MG tablet Take 1,000 mg by mouth 2 (two) times daily with a meal.   Yes Historical Provider, MD  sertraline (ZOLOFT) 50 MG tablet Take 1 tablet by mouth at bedtime. 07/12/16  Yes Historical Provider, MD  simvastatin (ZOCOR) 40 MG tablet Take 1 tablet by mouth daily. 07/12/16  Yes Historical Provider, MD  VICTOZA 18 MG/3ML SOPN 12 mg daily. Use as directed 07/12/16  Yes Historical Provider, MD    Family History History reviewed. No pertinent family history.  Social History Social History  Substance Use Topics  . Smoking status: Never Smoker  . Smokeless tobacco: Never Used  . Alcohol use 25.2 oz/week    42 Cans of beer per week     Comment: 5-6 beers daily     Allergies   Amoxicillin-pot clavulanate; Morphine and related; and Sildenafil   Review of Systems Review of Systems ROS: Statement: All systems negative except as marked or noted in the HPI; Constitutional: Negative for fever and chills. ; ; Eyes: Negative for eye pain, redness and discharge. ; ; ENMT: +left sided neck swelling. Negative for ear pain, hoarseness, nasal congestion, sinus pressure and sore throat. ; ; Cardiovascular: Negative for chest pain, palpitations, diaphoresis, dyspnea and peripheral edema. ; ; Respiratory: Negative for cough, wheezing and stridor. ; ; Gastrointestinal: Negative for nausea, vomiting, diarrhea, abdominal pain, blood in  stool, hematemesis, jaundice and rectal bleeding. . ; ; Genitourinary: Negative for dysuria, flank pain and hematuria. ; ; Musculoskeletal: Negative for back pain and neck pain. Negative for swelling and trauma.; ; Skin: Negative for pruritus, rash, abrasions, blisters, bruising and skin lesion.; ; Neuro: Negative for headache, lightheadedness and neck stiffness. Negative for weakness, altered level of consciousness, altered mental status, extremity weakness, paresthesias,  involuntary movement, seizure and syncope.      Physical Exam Updated Vital Signs BP 183/97 (BP Location: Left Arm)   Pulse 101   Temp 97.5 F (36.4 C) (Oral)   Resp 20   Ht 6\' 2"  (1.88 m)   Wt 295 lb (133.8 kg)   SpO2 98%   BMI 37.88 kg/m   Physical Exam 1655: Physical examination:  Nursing notes reviewed; Vital signs and O2 SAT reviewed;  Constitutional: Well developed, Well nourished, Well hydrated, In no acute distress; Head:  Normocephalic, atraumatic; Eyes: EOMI, PERRL, No scleral icterus; ENMT: TM's clear bilat. Mouth and pharynx without lesions. No tonsillar exudates. No intra-oral edema. No gingival edema. No submandibular or sublingual edema. No hoarse voice, no drooling, no stridor. No pain with manipulation of larynx. No trismus.  Mouth and pharynx normal, Mucous membranes moist; Neck: Supple, Full range of motion, +left sided upper neck swelling, no erythema, no soft tissue crepitus. No parotid area swelling or tenderness.; Cardiovascular: Regular rate and rhythm, No gallop; Respiratory: Breath sounds clear & equal bilaterally, No wheezes.  Speaking full sentences with ease, Normal respiratory effort/excursion; Chest: Nontender, Movement normal; Abdomen: Soft, Nontender, Nondistended, Normal bowel sounds; Genitourinary: No CVA tenderness; Extremities: Pulses normal, No tenderness, No edema, No calf edema or asymmetry.; Neuro: AA&Ox3, Major CN grossly intact.  Speech clear. No gross focal motor or sensory deficits in extremities.; Skin: Color normal, Warm, Dry.    ED Treatments / Results  Labs (all labs ordered are listed, but only abnormal results are displayed)   EKG  EKG Interpretation None       Radiology   Procedures Procedures (including critical care time)  Medications Ordered in ED Medications  diphenhydrAMINE (BENADRYL) injection 25 mg (25 mg Intravenous Given 07/21/16 1712)  methylPREDNISolone sodium succinate (SOLU-MEDROL) 125 mg/2 mL injection 125 mg  (125 mg Intravenous Given 07/21/16 1712)  famotidine (PEPCID) IVPB 20 mg premix (20 mg Intravenous New Bag/Given 07/21/16 1716)     Initial Impression / Assessment and Plan / ED Course  I have reviewed the triage vital signs and the nursing notes.  Pertinent labs & imaging results that were available during my care of the patient were reviewed by me and considered in my medical decision making (see chart for details).  MDM Reviewed: previous chart, nursing note and vitals Reviewed previous: labs Interpretation: labs and CT scan   Results for orders placed or performed during the hospital encounter of [id number]  Basic metabolic panel  Result Value Ref Range   Sodium 135 135 - 145 mmol/L   Potassium 3.8 3.5 - 5.1 mmol/L   Chloride 97 (L) 101 - 111 mmol/L   CO2 30 22 - 32 mmol/L   Glucose, Bld 270 (H) 65 - 99 mg/dL   BUN 18 6 - 20 mg/dL   Creatinine, Ser 1.26 (H) 0.61 - 1.24 mg/dL   Calcium 10.0 8.9 - 10.3 mg/dL   GFR calc non Af Amer 60 (L) >60 mL/min   GFR calc Af Amer >60 >60 mL/min   Anion gap 8 5 - 15  Lactic acid, plasma  Result  Value Ref Range   Lactic Acid, Venous 2.2 (HH) 0.5 - 1.9 mmol/L  Lactic acid, plasma  Result Value Ref Range   Lactic Acid, Venous 1.9 0.5 - 1.9 mmol/L  CBC with Differential  Result Value Ref Range   WBC 8.7 4.0 - 10.5 K/uL   RBC 4.41 4.22 - 5.81 MIL/uL   Hemoglobin 14.6 13.0 - 17.0 g/dL   HCT 42.6 39.0 - 52.0 %   MCV 96.6 78.0 - 100.0 fL   MCH 33.1 26.0 - 34.0 pg   MCHC 34.3 30.0 - 36.0 g/dL   RDW 12.6 11.5 - 15.5 %   Platelets 208 150 - 400 K/uL   Neutrophils Relative % 70 %   Neutro Abs 6.1 1.7 - 7.7 K/uL   Lymphocytes Relative 18 %   Lymphs Abs 1.6 0.7 - 4.0 K/uL   Monocytes Relative 10 %   Monocytes Absolute 0.9 0.1 - 1.0 K/uL   Eosinophils Relative 2 %   Eosinophils Absolute 0.2 0.0 - 0.7 K/uL   Basophils Relative 0 %   Basophils Absolute 0.0 0.0 - 0.1 K/uL   Ct Soft Tissue Neck W Contrast Result Date: 07/21/2016 CLINICAL  DATA:  Initial evaluation for sudden onset neck swelling 2 hours ago. EXAM: CT NECK WITH CONTRAST TECHNIQUE: Multidetector CT imaging of the neck was performed using the standard protocol following the bolus administration of intravenous contrast. CONTRAST:  56mL ISOVUE-300 IOPAMIDOL (ISOVUE-300) INJECTION 61% COMPARISON:  None available. FINDINGS: Pharynx and larynx: Oral cavity within normal limits. No obvious swelling of the oral tongue. No mass lesion or loculated fluid collection. No acute abnormality about the dentition. Nasopharynx within normal limits. Palatine tonsils symmetric and within normal limits. Mild edema within the or pharyngeal mucosa, slightly greater on the left. Epiglottis itself is within normal limits. No significant retropharyngeal edema. Inferiorly, there is prominent edema at the base of the left area epiglottic fold, effacing the left piriform sinus (series 2, image 73). Supraglottic airway is moderately narrowed at this point, measuring 7 mm at its most narrow point. Subtle inflammatory stranding within the adjacent left parapharyngeal and carotid spaces. Inflammatory stranding extends laterally to involve the left masticator space, with thickening and irregularity of the left platysmas (series 2, image 67). Additional hazy soft tissue stranding within the subcutaneous fat of the anterior neck (series 2, image 75). No drainable fluid collection. True cords fairly symmetric and within normal limits. Subglottic airway is clear. Salivary glands: Parotid glands are within normal limits. Inflammatory stranding surrounds the left submandibular gland, however, the gland itself is felt to be relatively normal and symmetric with the right. Thyroid: Thyroid grossly normal. Lymph nodes: No significant adenopathy within the neck. Vascular: Normal intravascular enhancement seen within the neck. Atheromatous plaque noted about the left bifurcation. Limited intracranial: Negative. Visualized orbits:  Visualized globes and orbits within normal limits. Mastoids and visualized paranasal sinuses: Visualized paranasal sinuses are clear. No mastoid effusion. Middle ear cavities are clear. Skeleton: No acute osseous abnormality. Degenerative changes noted within the visualized spine, greatest at C6-7. No worrisome lytic or blastic osseous lesions. Upper chest: Visualized mediastinum within normal limits. Visualized lung apices are largely clear. IMPRESSION: Prominent mucosal edema involving primarily the left pharynx as above, with extension of inflammatory changes into the left parapharyngeal space and left neck, with additional involvement of the subcutaneous aspect of the anterior neck. Given the sudden onset of symptoms, findings may reflect sequelae of an allergic reaction. While possible infection could also be considered, this is  perhaps less favored given the sudden onset of symptoms and lack of additional infectious symptoms. Additionally, while the inflammatory changes surround the left submandibular gland, the gland itself is not felt to be the epicenter of the inflammatory process. No submandibular gland stone identified. No abscess or drainable fluid collection. Close clinical monitoring is recommended, as the supraglottic airway is moderately narrowed, measuring 6-7 mm in diameter at its most narrow point. Results were discussed by telephone at the time of interpretation on 07/21/2016 at 8:42 pm with Dr. Francine Graven , who verbally acknowledged these results. Electronically Signed   By: Jeannine Boga M.D.   On: 07/21/2016 20:53    2105:  On arrival, pt tx for possible allergic reaction with IV solumedrol, IV pepcid, IV benadryl. Pt continues without SOB/wheezing/stridor. No intra-oral edema. Dx and testing d/w pt and family.  Questions answered.  Verb understanding, agreeable to admit.  T/C to [facility]. Marin Comment, case discussed, including:  HPI, pertinent PM/SHx, VS/PE, dx testing, ED course and  treatment:  Agreeable to admit, requests he will come to the ED for evaluation.     Final Clinical Impressions(s) / ED Diagnoses   Final diagnoses:  None    New Prescriptions New Prescriptions   No medications on file     Francine Graven, DO 07/23/16 2240

## 2016-07-21 NOTE — H&P (Signed)
History and Physical    Rody Prokosch Gelardi [id number] DOB: 1956-02-11 DOA: 07/21/2016  PCP: Asencion Noble, MD  Patient coming from:  Home.    Chief Complaint:   Swelling of the throat, left greater than right.   HPI: Torre Carulli Pendelton is an 60 y.o. male with hx of DM on Victoza and Metformin, HLD, HTN on Lisinopril 10mg  per day for several years, with no coughs, or prior swelling of the throat, presented to the ER as he was having swelling of his throat with no lip or tongue swelling.  He has no trouble breathing at this time.  He had no prior similar episode.  At around 14:30 today, after eating barbarcue, cole slaw, and fries, he noted some swelling of the throat.  His sister in laws works as an Therapist, sports recommended that he comes to the ER.  Evaluation in the ER with CT of the neck showed no abscesses, but the supraglotic airway is moderately narrowed. He has no fever and no leukocytosis, and earlier today, he had no symptoms.  Hospitalist was asked to admit him for an allergic reaction.     ED Course:  See above.   Past Medical History:  Diagnosis Date  . Diabetes mellitus without complication ([place])   . High cholesterol   . Hypertension     Rewiew of Systems:  Constitutional: Negative for malaise, fever and chills. No significant weight loss or weight gain Eyes: Negative for eye pain, redness and discharge, diplopia, visual changes, or flashes of light. ENMT: Negative for ear pain, nasal congestion, sinus pressure and sore throat. No headaches; tinnitus, drooling, or problem swallowing. Cardiovascular: Negative for chest pain, palpitations, diaphoresis, dyspnea and peripheral edema. ; No orthopnea, PND Respiratory: Negative for cough, hemoptysis, wheezing and stridor. No pleuritic chestpain. Gastrointestinal: Negative for nausea, vomiting, diarrhea, constipation, abdominal pain, melena, blood in stool, hematemesis, jaundice and rectal bleeding.    Genitourinary: Negative for frequency, dysuria,  incontinence,flank pain and hematuria; Musculoskeletal: Negative for back pain and neck pain. Negative for swelling and trauma.;  Skin: . Negative for pruritus, rash, abrasions, bruising and skin lesion.; ulcerations Neuro: Negative for headache, lightheadedness and neck stiffness. Negative for weakness, altered level of consciousness , altered mental status, extremity weakness, burning feet, involuntary movement, seizure and syncope.  Psych: negative for anxiety, depression, insomnia, tearfulness, panic attacks, hallucinations, paranoia, suicidal or homicidal ideation   Past Surgical History:  Procedure Laterality Date  . HAND SURGERY       reports that he has never smoked. He has never used smokeless tobacco. He reports that he drinks about 25.2 oz of alcohol per week . He reports that he does not use drugs.  Allergies  Allergen Reactions  . Amoxicillin-Pot Clavulanate Itching    REACTION: hives  . Morphine And Related Other (See Comments)    hypertension  . Sildenafil     REACTION: headaches    History reviewed. No pertinent family history.   Prior to Admission medications   Medication Sig Start Date End Date Taking? Authorizing Provider  aspirin EC 81 MG tablet Take 81 mg by mouth daily.   Yes Historical Provider, MD  glimepiride (AMARYL) 2 MG tablet Take 1 tablet by mouth daily. 06/16/16  Yes Historical Provider, MD  ibuprofen (ADVIL,MOTRIN) 200 MG tablet Take 600 mg by mouth every 6 (six) hours as needed for moderate pain.   Yes Historical Provider, MD  LEVITRA 20 MG tablet TAKE ONE TABLET BY MOUTH EVERY DAY AS NEEDED 12/18/10  Yes  Hali Marry, MD  lisinopril (PRINIVIL,ZESTRIL) 10 MG tablet Take 1 tablet by mouth daily. 07/12/16  Yes Historical Provider, MD  metFORMIN (GLUCOPHAGE) 1000 MG tablet Take 1,000 mg by mouth 2 (two) times daily with a meal.   Yes Historical Provider, MD  sertraline (ZOLOFT) 50 MG tablet Take 1 tablet by mouth at bedtime. 07/12/16  Yes  Historical Provider, MD  simvastatin (ZOCOR) 40 MG tablet Take 1 tablet by mouth daily. 07/12/16  Yes Historical Provider, MD  VICTOZA 18 MG/3ML SOPN 12 mg daily. Use as directed 07/12/16  Yes Historical Provider, MD    Physical Exam: Vitals:   07/21/16 1931 07/21/16 2013 07/21/16 2030 07/21/16 2143  BP: 160/95 160/99 147/92 165/91  Pulse: 80 91 87 93  Resp: 22 19 20 18   Temp:      TempSrc:      SpO2: 92% 95% 96% 96%  Weight:      Height:          Constitutional: NAD, calm, comfortable Vitals:   07/21/16 1931 07/21/16 2013 07/21/16 2030 07/21/16 2143  BP: 160/95 160/99 147/92 165/91  Pulse: 80 91 87 93  Resp: 22 19 20 18   Temp:      TempSrc:      SpO2: 92% 95% 96% 96%  Weight:      Height:       Eyes: PERRL, lids and conjunctivae normal ENMT: Mucous membranes are moist. Posterior pharynx clear of any exudate or lesions.Normal dentition.  Neck: normal, supple, no masses, no thyromegaly.  There is no stridor.  Respiratory: clear to auscultation bilaterally, no wheezing, no crackles. Normal respiratory effort. No accessory muscle use.  Cardiovascular: Regular rate and rhythm, no murmurs / rubs / gallops. No extremity edema. 2+ pedal pulses. No carotid bruits.  Abdomen: no tenderness, no masses palpated. No hepatosplenomegaly. Bowel sounds positive.  Musculoskeletal: no clubbing / cyanosis. No joint deformity upper and lower extremities. Good ROM, no contractures. Normal muscle tone.  Skin: no rashes, lesions, ulcers. No induration Neurologic: CN 2-12 grossly intact. Sensation intact, DTR normal. Strength 5/5 in all 4.  Psychiatric: Normal judgment and insight. Alert and oriented x 3. Normal mood.   Labs on Admission: I have personally reviewed following labs and imaging studies  CBC:  Recent Labs Lab 07/21/16 1705  WBC 8.7  NEUTROABS 6.1  HGB 14.6  HCT 42.6  MCV 96.6  PLT 123XX123   Basic Metabolic Panel:  Recent Labs Lab 07/21/16 1705  NA 135  K 3.8  CL 97*    CO2 30  GLUCOSE 270*  BUN 18  CREATININE 1.26*  CALCIUM 10.0   GFR: Estimated Creatinine Clearance: 89.5 mL/min (by C-G formula based on SCr of 1.26 mg/dL (H)).  Radiological Exams on Admission: Ct Soft Tissue Neck W Contrast  Result Date: 07/21/2016 CLINICAL DATA:  Initial evaluation for sudden onset neck swelling 2 hours ago. EXAM: CT NECK WITH CONTRAST TECHNIQUE: Multidetector CT imaging of the neck was performed using the standard protocol following the bolus administration of intravenous contrast. CONTRAST:  9mL ISOVUE-300 IOPAMIDOL (ISOVUE-300) INJECTION 61% COMPARISON:  None available. FINDINGS: Pharynx and larynx: Oral cavity within normal limits. No obvious swelling of the oral tongue. No mass lesion or loculated fluid collection. No acute abnormality about the dentition. Nasopharynx within normal limits. Palatine tonsils symmetric and within normal limits. Mild edema within the or pharyngeal mucosa, slightly greater on the left. Epiglottis itself is within normal limits. No significant retropharyngeal edema. Inferiorly, there is prominent edema  at the base of the left area epiglottic fold, effacing the left piriform sinus (series 2, image 73). Supraglottic airway is moderately narrowed at this point, measuring 7 mm at its most narrow point. Subtle inflammatory stranding within the adjacent left parapharyngeal and carotid spaces. Inflammatory stranding extends laterally to involve the left masticator space, with thickening and irregularity of the left platysmas (series 2, image 67). Additional hazy soft tissue stranding within the subcutaneous fat of the anterior neck (series 2, image 75). No drainable fluid collection. True cords fairly symmetric and within normal limits. Subglottic airway is clear. Salivary glands: Parotid glands are within normal limits. Inflammatory stranding surrounds the left submandibular gland, however, the gland itself is felt to be relatively normal and symmetric  with the right. Thyroid: Thyroid grossly normal. Lymph nodes: No significant adenopathy within the neck. Vascular: Normal intravascular enhancement seen within the neck. Atheromatous plaque noted about the left bifurcation. Limited intracranial: Negative. Visualized orbits: Visualized globes and orbits within normal limits. Mastoids and visualized paranasal sinuses: Visualized paranasal sinuses are clear. No mastoid effusion. Middle ear cavities are clear. Skeleton: No acute osseous abnormality. Degenerative changes noted within the visualized spine, greatest at C6-7. No worrisome lytic or blastic osseous lesions. Upper chest: Visualized mediastinum within normal limits. Visualized lung apices are largely clear. IMPRESSION: Prominent mucosal edema involving primarily the left pharynx as above, with extension of inflammatory changes into the left parapharyngeal space and left neck, with additional involvement of the subcutaneous aspect of the anterior neck. Given the sudden onset of symptoms, findings may reflect sequelae of an allergic reaction. While possible infection could also be considered, this is perhaps less favored given the sudden onset of symptoms and lack of additional infectious symptoms. Additionally, while the inflammatory changes surround the left submandibular gland, the gland itself is not felt to be the epicenter of the inflammatory process. No submandibular gland stone identified. No abscess or drainable fluid collection. Close clinical monitoring is recommended, as the supraglottic airway is moderately narrowed, measuring 6-7 mm in diameter at its most narrow point. Results were discussed by telephone at the time of interpretation on 07/21/2016 at 8:42 pm with Dr. Francine Graven , who verbally acknowledged these results. Electronically Signed   By: Jeannine Boga M.D.   On: 07/21/2016 20:53    EKG: Independently reviewed.   Assessment/Plan Principal Problem:   Allergic  reaction Active Problems:   Hypothyroidism   HYPERTENSION, BENIGN SYSTEMIC   Sleep apnea    PLAN:   Allergic reaction vs early angioedema:    ACE I can cause angioedema even after years of use.  I don't think this is hereditary angioedema.  It is possible that it is simply an allergic reaction.  I will continue with IV steroid, and H 2 blocker, but will also give 2 units of FFP as well.  I would like to transfer him to [facility], in case he needs ENT consultation or emergent intubation, as [facility] doesn't have ENT or in house anesthesia.  He doesn't require any intervention at this time, and I don't think he requires SDU or ICU level of care at this time.   Will transfer him under [facility] service.   DM:  Will avoid pills at this time.  Use SSI.  HTN:  Hydralazine PRN, but his BP is not significantly elevated at this time.  D/C ACE I.    DVT prophylaxis: subQ heparin.  Code Status: FULL CODE.  Family Communication: None.  Disposition Plan: Likely to home when  appropriate.  Consults called: None. Admission status: OBS>    Mattison Stuckey MD FACP. [facility]  If 7PM-7AM, please contact night-coverage [email or web address] Password [email or web address]  07/21/2016, 9:52 PM

## 2016-07-21 NOTE — ED Notes (Signed)
Patient's O2 saturation 91-92% on room air. Respirations 22 bpm at this time. Patient denies shortness of breath. Placed patient on 2L O2. Patient's saturation increased to 96% on 2L via nasal canula.

## 2016-07-22 ENCOUNTER — Encounter (HOSPITAL_COMMUNITY): Payer: Self-pay | Admitting: General Practice

## 2016-07-22 DIAGNOSIS — T7840XA Allergy, unspecified, initial encounter: Secondary | ICD-10-CM | POA: Diagnosis not present

## 2016-07-22 LAB — BASIC METABOLIC PANEL
Anion gap: 11 (ref 5–15)
BUN: 16 mg/dL (ref 6–20)
CALCIUM: 9.5 mg/dL (ref 8.9–10.3)
CHLORIDE: 99 mmol/L — AB (ref 101–111)
CO2: 25 mmol/L (ref 22–32)
CREATININE: 0.92 mg/dL (ref 0.61–1.24)
GFR calc non Af Amer: >60 mL/min (ref >60)
Glucose, Bld: 274 mg/dL — ABNORMAL HIGH (ref 65–99)
Potassium: 4.2 mmol/L (ref 3.5–5.1)
SODIUM: 135 mmol/L (ref 135–145)

## 2016-07-22 LAB — TYPE AND SCREEN
ABO/RH(D): O POS
ANTIBODY SCREEN: NEGATIVE

## 2016-07-22 LAB — GLUCOSE, CAPILLARY
GLUCOSE-CAPILLARY: 235 mg/dL — AB (ref 65–99)
GLUCOSE-CAPILLARY: 253 mg/dL — AB (ref 65–99)
GLUCOSE-CAPILLARY: 328 mg/dL — AB (ref 65–99)
Glucose-Capillary: 243 mg/dL — ABNORMAL HIGH (ref 65–99)

## 2016-07-22 LAB — CBC
HCT: 42 % (ref 39.0–52.0)
Hemoglobin: 14.2 g/dL (ref 13.0–17.0)
MCH: 31.8 pg (ref 26.0–34.0)
MCHC: 33.8 g/dL (ref 30.0–36.0)
MCV: 94.2 fL (ref 78.0–100.0)
PLATELETS: 193 10*3/uL (ref 150–400)
RBC: 4.46 MIL/uL (ref 4.22–5.81)
RDW: 12.7 % (ref 11.5–15.5)
WBC: 9.7 10*3/uL (ref 4.0–10.5)

## 2016-07-22 LAB — ABO/RH: ABO/RH(D): O POS

## 2016-07-22 MED ORDER — FAMOTIDINE IN NACL 20-0.9 MG/50ML-% IV SOLN
20.0000 mg | Freq: Two times a day (BID) | INTRAVENOUS | Status: DC
  Administered 2016-07-22 (×2): 20 mg via INTRAVENOUS
  Filled 2016-07-22 (×3): qty 50

## 2016-07-22 MED ORDER — DIPHENHYDRAMINE HCL 50 MG/ML IJ SOLN
25.0000 mg | Freq: Three times a day (TID) | INTRAMUSCULAR | Status: DC
  Administered 2016-07-22 (×2): 25 mg via INTRAVENOUS
  Filled 2016-07-22 (×2): qty 1

## 2016-07-22 MED ORDER — DEXTROSE-NACL 5-0.9 % IV SOLN: INTRAVENOUS | Status: DC

## 2016-07-22 MED ORDER — METHYLPREDNISOLONE SODIUM SUCC 40 MG IJ SOLR
40.0000 mg | Freq: Four times a day (QID) | INTRAMUSCULAR | Status: DC
  Administered 2016-07-22 (×3): 40 mg via INTRAVENOUS
  Filled 2016-07-22 (×3): qty 1

## 2016-07-22 MED ORDER — PREDNISONE 10 MG PO TABS: ORAL_TABLET | ORAL | 0 refills | Status: DC

## 2016-07-22 MED ORDER — AMLODIPINE BESYLATE 5 MG PO TABS: 5.0000 mg | ORAL_TABLET | Freq: Every day | ORAL | 0 refills | Status: AC

## 2016-07-22 MED ORDER — DIPHENHYDRAMINE HCL 25 MG PO TABS: 25.0000 mg | ORAL_TABLET | Freq: Three times a day (TID) | ORAL | 0 refills | Status: DC | PRN

## 2016-07-22 MED ORDER — SODIUM CHLORIDE 0.9% FLUSH
3.0000 mL | Freq: Two times a day (BID) | INTRAVENOUS | Status: DC
  Administered 2016-07-22: 3 mL via INTRAVENOUS

## 2016-07-22 MED ORDER — HEPARIN SODIUM (PORCINE) 5000 UNIT/ML IJ SOLN: 5000.0000 [IU] | Freq: Three times a day (TID) | INTRAMUSCULAR | Status: DC

## 2016-07-22 MED ORDER — INSULIN ASPART 100 UNIT/ML ~~LOC~~ SOLN
0.0000 [IU] | SUBCUTANEOUS | Status: DC
  Administered 2016-07-22 (×3): 5 [IU] via SUBCUTANEOUS
  Administered 2016-07-22: 11 [IU] via SUBCUTANEOUS

## 2016-07-22 MED ORDER — SODIUM CHLORIDE 0.9 % IV SOLN: Freq: Once | INTRAVENOUS | Status: DC

## 2016-07-22 NOTE — Discharge Instructions (Signed)
Angioedema  Angioedema is sudden swelling in the body. The swelling can happen in any part of the body. It often happens on the skin and causes itchy, bumpy patches (hives) to form.  This condition may:  · Happen only one time.  · Happen more than one time. It may come back at random times.  · Keep coming back for a number of years. Someday it may stop coming back.    Follow these instructions at home:  · Take over-the-counter and prescription medicines only as told by your doctor.  · If you were given medicines for emergency allergy treatment, always carry them with you.  · Wear a medical bracelet as told by your doctor.  · Avoid the things that cause your attacks (triggers).  · If this condition was passed to you from your parents and you want to have kids, talk to your doctor. Your kids may also have this condition.  Contact a doctor if:  · You have another attack.  · Your attacks happen more often, even after you take steps to prevent them.  · This condition was passed to you by your parents and you want to have kids.  Get help right away if:  · Your mouth, tongue, or lips get very swollen.  · You have trouble breathing.  · You have trouble swallowing.  · You pass out (faint).  This information is not intended to replace advice given to you by your health care provider. Make sure you discuss any questions you have with your health care provider.  Document Released: 07/20/2009 Document Revised: 03/02/2016 Document Reviewed: 02/09/2016  Elsevier Interactive Patient Education © 2017 [employer]

## 2016-07-22 NOTE — Progress Notes (Signed)
Inpatient Diabetes Program Recommendations  AACE/ADA: New Consensus Statement on Inpatient Glycemic Control (2015)  Target Ranges:  Prepandial:   less than 140 mg/dL      Peak postprandial:   less than 180 mg/dL (1-2 hours)      Critically ill patients:  140 - 180 mg/dL   Lab Results  Component Value Date   GLUCAP 235 (H) 07/22/2016   HGBA1C 6.9 09/22/2009    Review of Glycemic Control Results for TALIQ, CHILCOAT (MRN [id number]) as of 07/22/2016 11:10  Ref. Range 07/22/2016 00:24 07/22/2016 04:21 07/22/2016 07:59  Glucose-Capillary Latest Ref Range: 65 - 99 mg/dL 328 (H) 243 (H) 235 (H)   Diabetes history: DM2 Outpatient Diabetes medications: Amaryl 2 mg qd + Metformin 1 gm bid+Victoza 12 mg qd Current orders for Inpatient glycemic control: Novolog correction 0-15 units q 4 hrs.  Inpatient Diabetes Program Recommendations:  Noted elevated CBGs. Please consider basal insulin while oral medications held-Lantus 0.2 units/kg =27 units.  Thank you, Nani Gasser. Lot Medford, RN, MSN, CDE Inpatient Glycemic Control Team Team Pager [phone] (8am-5pm) 07/22/2016 11:17 AM

## 2016-07-22 NOTE — Progress Notes (Signed)
Pt not able to get appt with PCP until next Friday.  He would like BP med to go home on until then.  Dr. Doyle Askew notified via text page.

## 2016-07-22 NOTE — Discharge Summary (Signed)
Physician Discharge Summary  Jonathon Lin [id number] DOB: 01-18-1956 DOA: 07/21/2016  PCP: Asencion Noble, MD  Admit date: 07/21/2016 Discharge date: 07/22/2016  Recommendations for Outpatient Follow-up:  1. Pt will need to follow up with PCP in 1-2 weeks post discharge 2. Please obtain BMP to evaluate electrolytes and kidney function 3. Please note lisinopril discontinued due to suspected angioedema, pt started on Norvasc instead  4. Please note allergic reaction: ACEI  Discharge Diagnoses:  Principal Problem:   Allergic reaction Active Problems:   Hypothyroidism   HYPERTENSION, BENIGN SYSTEMIC   Sleep apnea  Discharge Condition: Stable  Diet recommendation: Heart healthy diet discussed in details   History of present illness:  60 y.o. male with hx of DM on Victoza and Metformin, HLD, HTN on Lisinopril 10mg  per day for several years, with no coughs, or prior swelling of the throat, presented to the ER as he was having swelling of his throat with no lip or tongue swelling.  He has no trouble breathing. He had no prior similar episode.Evaluation in the ER with CT of the neck showed no abscesses, but the supraglotic airway was moderately narrowed.   Hospital Course:  Principal Problem:   Allergic reaction, angioedema  - most likely from ACEI, Lisinopril - this was discontinued, diet advanced and pt tolerating well, wants to go home  Active Problems:   Hypothyroidism - continue home medical regimen, synthroid     HYPERTENSION, BENIGN SYSTEMIC - started Norvasc     DM type II - continue home medical regimen      Obesity, morbid in pt with underlying risk factors, HLD, DM and BMI > 35 - Body mass index is 38.84 kg/m.  Procedures/Studies: Ct Soft Tissue Neck W Contrast  Result Date: 07/21/2016 CLINICAL DATA:  Initial evaluation for sudden onset neck swelling 2 hours ago. EXAM: CT NECK WITH CONTRAST TECHNIQUE: Multidetector CT imaging of the neck was performed using the  standard protocol following the bolus administration of intravenous contrast. CONTRAST:  37mL ISOVUE-300 IOPAMIDOL (ISOVUE-300) INJECTION 61% COMPARISON:  None available. FINDINGS: Pharynx and larynx: Oral cavity within normal limits. No obvious swelling of the oral tongue. No mass lesion or loculated fluid collection. No acute abnormality about the dentition. Nasopharynx within normal limits. Palatine tonsils symmetric and within normal limits. Mild edema within the or pharyngeal mucosa, slightly greater on the left. Epiglottis itself is within normal limits. No significant retropharyngeal edema. Inferiorly, there is prominent edema at the base of the left area epiglottic fold, effacing the left piriform sinus (series 2, image 73). Supraglottic airway is moderately narrowed at this point, measuring 7 mm at its most narrow point. Subtle inflammatory stranding within the adjacent left parapharyngeal and carotid spaces. Inflammatory stranding extends laterally to involve the left masticator space, with thickening and irregularity of the left platysmas (series 2, image 67). Additional hazy soft tissue stranding within the subcutaneous fat of the anterior neck (series 2, image 75). No drainable fluid collection. True cords fairly symmetric and within normal limits. Subglottic airway is clear. Salivary glands: Parotid glands are within normal limits. Inflammatory stranding surrounds the left submandibular gland, however, the gland itself is felt to be relatively normal and symmetric with the right. Thyroid: Thyroid grossly normal. Lymph nodes: No significant adenopathy within the neck. Vascular: Normal intravascular enhancement seen within the neck. Atheromatous plaque noted about the left bifurcation. Limited intracranial: Negative. Visualized orbits: Visualized globes and orbits within normal limits. Mastoids and visualized paranasal sinuses: Visualized paranasal sinuses are clear. No  mastoid effusion. Middle ear  cavities are clear. Skeleton: No acute osseous abnormality. Degenerative changes noted within the visualized spine, greatest at C6-7. No worrisome lytic or blastic osseous lesions. Upper chest: Visualized mediastinum within normal limits. Visualized lung apices are largely clear. IMPRESSION: Prominent mucosal edema involving primarily the left pharynx as above, with extension of inflammatory changes into the left parapharyngeal space and left neck, with additional involvement of the subcutaneous aspect of the anterior neck. Given the sudden onset of symptoms, findings may reflect sequelae of an allergic reaction. While possible infection could also be considered, this is perhaps less favored given the sudden onset of symptoms and lack of additional infectious symptoms. Additionally, while the inflammatory changes surround the left submandibular gland, the gland itself is not felt to be the epicenter of the inflammatory process. No submandibular gland stone identified. No abscess or drainable fluid collection. Close clinical monitoring is recommended, as the supraglottic airway is moderately narrowed, measuring 6-7 mm in diameter at its most narrow point. Results were discussed by telephone at the time of interpretation on 07/21/2016 at 8:42 pm with Dr. Francine Graven , who verbally acknowledged these results. Electronically Signed   By: Jeannine Boga M.D.   On: 07/21/2016 20:53    Discharge Exam: Vitals:   07/22/16 0614 07/22/16 0644  BP: 133/79 132/78  Pulse: 93 95  Resp: 18 18  Temp: 97.8 F (36.6 C) 98.7 F (37.1 C)   Vitals:   07/22/16 0505 07/22/16 0614 07/22/16 0644 07/22/16 0842  BP: (!) 141/84 133/79 132/78   Pulse: 97 93 95   Resp: 18 18 18    Temp: 98.5 F (36.9 C) 97.8 F (36.6 C) 98.7 F (37.1 C)   TempSrc: Oral Oral Oral   SpO2: 97% 96% 95% 97%  Weight:      Height:        General: Pt is alert, follows commands appropriately, not in acute distress Cardiovascular:  Regular rate and rhythm, S1/S2 +, no rubs, no gallops Respiratory: Clear to auscultation bilaterally, no wheezing, no crackles, no rhonchi Abdominal: Soft, non tender, non distended, bowel sounds +, no guarding Extremities: no edema, no cyanosis, pulses palpable bilaterally DP and PT Neuro: Grossly nonfocal  Discharge Instructions  Discharge Instructions    Diet - low sodium heart healthy    Complete by:  As directed    Increase activity slowly    Complete by:  As directed        Medication List    STOP taking these medications   lisinopril 10 MG tablet Commonly known as:  PRINIVIL,ZESTRIL     TAKE these medications   amLODipine 5 MG tablet Commonly known as:  NORVASC Take 1 tablet (5 mg total) by mouth daily.   aspirin EC 81 MG tablet Take 81 mg by mouth daily. Notes to patient:  Not given in hospital setting, please continue usual home dosage as ordered.   diphenhydrAMINE 25 MG tablet Commonly known as:  BENADRYL Take 1 tablet (25 mg total) by mouth every 8 (eight) hours as needed for allergies. Notes to patient:  Last dose given 07/22/2016 @ 0545   glimepiride 2 MG tablet Commonly known as:  AMARYL Take 1 tablet by mouth daily. Notes to patient:  Not given in hospital setting, please continue usual home dosage as ordered.   ibuprofen 200 MG tablet Commonly known as:  ADVIL,MOTRIN Take 600 mg by mouth every 6 (six) hours as needed for moderate pain. Notes to patient:  Not given  in hospital setting, please continue usual home dosage as ordered.   LEVITRA 20 MG tablet Generic drug:  vardenafil TAKE ONE TABLET BY MOUTH EVERY DAY AS NEEDED Notes to patient:  Not given in hospital setting, please continue usual home dosage as ordered.   metFORMIN 1000 MG tablet Commonly known as:  GLUCOPHAGE Take 1,000 mg by mouth 2 (two) times daily with a meal. Notes to patient:  Not given in hospital setting, please continue usual home dosage as ordered.   predniSONE 10 MG  tablet Commonly known as:  DELTASONE Take Prednisone 50 mg tablet 07/23/2016 and taper down by 10 mg daily until completed.   sertraline 50 MG tablet Commonly known as:  ZOLOFT Take 1 tablet by mouth at bedtime. Notes to patient:  Not given in hospital setting, please continue usual home dosage as ordered.   simvastatin 40 MG tablet Commonly known as:  ZOCOR Take 1 tablet by mouth daily. Notes to patient:  Not given in hospital setting, please continue usual home dosage as ordered.   VICTOZA 18 MG/3ML Sopn Generic drug:  liraglutide 12 mg daily. Use as directed Notes to patient:  Not given in hospital setting, please continue usual home dosage as ordered.       Follow-up Information    Asencion Noble, MD Follow up.   Specialty:  Internal Medicine Contact information: [address] [place] [address] [phone]        Faye Ramsay, MD Follow up.   Specialty:  Internal Medicine Why:  call my cell phone wiht questions [phone] Contact information: [address] [place] [address] [phone]            The results of significant diagnostics from this hospitalization (including imaging, microbiology, ancillary and laboratory) are listed below for reference.     Microbiology: No results found for this or any previous visit (from the past 240 hour(s)).   Labs: Basic Metabolic Panel:  Recent Labs Lab 07/21/16 1705 07/22/16 0333  NA 135 135  K 3.8 4.2  CL 97* 99*  CO2 30 25  GLUCOSE 270* 274*  BUN 18 16  CREATININE 1.26* 0.92  CALCIUM 10.0 9.5   CBC:  Recent Labs Lab 07/21/16 1705 07/22/16 0333  WBC 8.7 9.7  NEUTROABS 6.1  --   HGB 14.6 14.2  HCT 42.6 42.0  MCV 96.6 94.2  PLT 208 193   CBG:  Recent Labs Lab 07/22/16 0024 07/22/16 0421 07/22/16 0759  GLUCAP 328* 243* 235*     SIGNED: Time coordinating discharge: 30 minutes  MAGICK-Brandyn Thien, MD  [facility] 07/22/2016, 11:22  AM Pager [phone]  If 7PM-7AM, please contact night-coverage [email or web address] Password [email or web address]

## 2016-07-22 NOTE — Progress Notes (Signed)
Dr. Doyle Askew notified that pt reports more swelling in left side of neck/throat.  He states no difficulty breathing/swallowing.  He has eaten lunch and states that he feels comfortable being discharged to home with rx for steroids and benadryl prn.  Dr. Doyle Askew also notified of pt current BP 147/86  and asked about changing to new home BP med.  She would like pt to follow up with PCP.  Pt aware and comfortable with this plan.   Telemetry has already been discontinued this morning. IV removed.

## 2016-07-23 LAB — PREPARE FRESH FROZEN PLASMA
UNIT DIVISION: 0
Unit division: 0

## 2016-07-29 DIAGNOSIS — T783XXA Angioneurotic edema, initial encounter: Secondary | ICD-10-CM | POA: Diagnosis not present

## 2016-07-29 DIAGNOSIS — I10 Essential (primary) hypertension: Secondary | ICD-10-CM | POA: Diagnosis not present

## 2016-08-11 DIAGNOSIS — M5432 Sciatica, left side: Secondary | ICD-10-CM | POA: Diagnosis not present

## 2016-08-11 DIAGNOSIS — M9903 Segmental and somatic dysfunction of lumbar region: Secondary | ICD-10-CM | POA: Diagnosis not present

## 2017-01-20 DIAGNOSIS — E785 Hyperlipidemia, unspecified: Secondary | ICD-10-CM | POA: Diagnosis not present

## 2017-01-20 DIAGNOSIS — I10 Essential (primary) hypertension: Secondary | ICD-10-CM | POA: Diagnosis not present

## 2017-01-20 DIAGNOSIS — Z79899 Other long term (current) drug therapy: Secondary | ICD-10-CM | POA: Diagnosis not present

## 2017-01-20 DIAGNOSIS — Z125 Encounter for screening for malignant neoplasm of prostate: Secondary | ICD-10-CM | POA: Diagnosis not present

## 2017-01-20 DIAGNOSIS — E119 Type 2 diabetes mellitus without complications: Secondary | ICD-10-CM | POA: Diagnosis not present

## 2017-01-24 DIAGNOSIS — Z0001 Encounter for general adult medical examination with abnormal findings: Secondary | ICD-10-CM | POA: Diagnosis not present

## 2017-01-24 DIAGNOSIS — I10 Essential (primary) hypertension: Secondary | ICD-10-CM | POA: Diagnosis not present

## 2017-01-24 DIAGNOSIS — E1129 Type 2 diabetes mellitus with other diabetic kidney complication: Secondary | ICD-10-CM | POA: Diagnosis not present

## 2017-01-24 DIAGNOSIS — Z6839 Body mass index (BMI) 39.0-39.9, adult: Secondary | ICD-10-CM | POA: Diagnosis not present

## 2018-02-06 DIAGNOSIS — E785 Hyperlipidemia, unspecified: Secondary | ICD-10-CM | POA: Diagnosis not present

## 2018-02-06 DIAGNOSIS — I10 Essential (primary) hypertension: Secondary | ICD-10-CM | POA: Diagnosis not present

## 2018-02-06 DIAGNOSIS — Z79899 Other long term (current) drug therapy: Secondary | ICD-10-CM | POA: Diagnosis not present

## 2018-02-06 DIAGNOSIS — E1129 Type 2 diabetes mellitus with other diabetic kidney complication: Secondary | ICD-10-CM | POA: Diagnosis not present

## 2018-02-06 DIAGNOSIS — Z125 Encounter for screening for malignant neoplasm of prostate: Secondary | ICD-10-CM | POA: Diagnosis not present

## 2018-02-13 DIAGNOSIS — I10 Essential (primary) hypertension: Secondary | ICD-10-CM | POA: Diagnosis not present

## 2018-02-13 DIAGNOSIS — E1129 Type 2 diabetes mellitus with other diabetic kidney complication: Secondary | ICD-10-CM | POA: Diagnosis not present

## 2018-02-13 DIAGNOSIS — Z6836 Body mass index (BMI) 36.0-36.9, adult: Secondary | ICD-10-CM | POA: Diagnosis not present

## 2018-02-13 DIAGNOSIS — Z0001 Encounter for general adult medical examination with abnormal findings: Secondary | ICD-10-CM | POA: Diagnosis not present

## 2018-05-22 DIAGNOSIS — E1129 Type 2 diabetes mellitus with other diabetic kidney complication: Secondary | ICD-10-CM | POA: Diagnosis not present

## 2018-05-29 DIAGNOSIS — E1159 Type 2 diabetes mellitus with other circulatory complications: Secondary | ICD-10-CM | POA: Diagnosis not present

## 2018-05-29 DIAGNOSIS — F410 Panic disorder [episodic paroxysmal anxiety] without agoraphobia: Secondary | ICD-10-CM | POA: Diagnosis not present

## 2018-05-29 DIAGNOSIS — Z23 Encounter for immunization: Secondary | ICD-10-CM | POA: Diagnosis not present

## 2018-09-25 DIAGNOSIS — E1159 Type 2 diabetes mellitus with other circulatory complications: Secondary | ICD-10-CM | POA: Diagnosis not present

## 2018-10-02 DIAGNOSIS — I10 Essential (primary) hypertension: Secondary | ICD-10-CM | POA: Diagnosis not present

## 2018-10-02 DIAGNOSIS — Z6832 Body mass index (BMI) 32.0-32.9, adult: Secondary | ICD-10-CM | POA: Diagnosis not present

## 2018-10-02 DIAGNOSIS — E1129 Type 2 diabetes mellitus with other diabetic kidney complication: Secondary | ICD-10-CM | POA: Diagnosis not present

## 2019-01-29 DIAGNOSIS — E1129 Type 2 diabetes mellitus with other diabetic kidney complication: Secondary | ICD-10-CM | POA: Diagnosis not present

## 2019-01-29 DIAGNOSIS — I10 Essential (primary) hypertension: Secondary | ICD-10-CM | POA: Diagnosis not present

## 2019-02-05 DIAGNOSIS — I10 Essential (primary) hypertension: Secondary | ICD-10-CM | POA: Diagnosis not present

## 2019-02-05 DIAGNOSIS — Z79899 Other long term (current) drug therapy: Secondary | ICD-10-CM | POA: Diagnosis not present

## 2019-02-05 DIAGNOSIS — E785 Hyperlipidemia, unspecified: Secondary | ICD-10-CM | POA: Diagnosis not present

## 2019-02-05 DIAGNOSIS — E1129 Type 2 diabetes mellitus with other diabetic kidney complication: Secondary | ICD-10-CM | POA: Diagnosis not present

## 2019-03-05 DIAGNOSIS — E1129 Type 2 diabetes mellitus with other diabetic kidney complication: Secondary | ICD-10-CM | POA: Diagnosis not present

## 2019-03-05 DIAGNOSIS — I10 Essential (primary) hypertension: Secondary | ICD-10-CM | POA: Diagnosis not present

## 2019-03-05 DIAGNOSIS — E785 Hyperlipidemia, unspecified: Secondary | ICD-10-CM | POA: Diagnosis not present

## 2019-09-24 ENCOUNTER — Other Ambulatory Visit: Payer: Self-pay

## 2019-09-24 ENCOUNTER — Other Ambulatory Visit: Payer: Self-pay | Admitting: Sports Medicine

## 2019-09-24 ENCOUNTER — Ambulatory Visit: Payer: Self-pay | Admitting: Sports Medicine

## 2019-09-24 ENCOUNTER — Ambulatory Visit (INDEPENDENT_AMBULATORY_CARE_PROVIDER_SITE_OTHER): Payer: Self-pay

## 2019-09-24 ENCOUNTER — Encounter: Payer: Self-pay | Admitting: Sports Medicine

## 2019-09-24 VITALS — BP 166/97 | HR 80 | Temp 97.3°F | Resp 16

## 2019-09-24 DIAGNOSIS — M7989 Other specified soft tissue disorders: Secondary | ICD-10-CM

## 2019-09-24 DIAGNOSIS — E119 Type 2 diabetes mellitus without complications: Secondary | ICD-10-CM

## 2019-09-24 DIAGNOSIS — S93601A Unspecified sprain of right foot, initial encounter: Secondary | ICD-10-CM

## 2019-09-24 DIAGNOSIS — M779 Enthesopathy, unspecified: Secondary | ICD-10-CM

## 2019-09-24 DIAGNOSIS — M79671 Pain in right foot: Secondary | ICD-10-CM

## 2019-09-24 NOTE — Progress Notes (Signed)
Subjective: Jonathon Lin is a 64 y.o. male patient who presents to office for evaluation of right foot pain. Patient complains of progressive pain especially over the last 1.5 weeks in the right foot at the lateral side that started after going for a 1 mile walk reports that his foot all of a sudden started to swell up and started to hurt pain on average 5 out of 10 reports that he soak with Epson salt that seem to help patient is also diabetic and reports that his blood sugar on yesterday was 140.  Patient denies any redness warmth drainage or acute signs of infection.  Review of Systems  All other systems reviewed and are negative.    Patient Active Problem List   Diagnosis Date Noted  . Allergic reaction 07/21/2016  . Adenomatous colon polyp 08/10/2015  . VIRAL URI 09/22/2009  . DEPRESSION 05/26/2009  . ALLERGIC RHINITIS 05/26/2009  . Hypothyroidism 03/19/2009  . OTHER SPECIFIED DISEASE OF NAIL 03/17/2009  . PANIC ATTACK 02/10/2009  . COUGH 02/10/2009  . INTERTRIGO, CANDIDAL 11/11/2008  . DIABETES MELLITUS II, UNCOMPLICATED [id number]  . HYPERLIPIDEMIA 05/23/2006  . OBESITY, NOS 05/23/2006  . ERECTILE DYSFUNCTION 05/23/2006  . HYPERTENSION, BENIGN SYSTEMIC 05/23/2006  . SEBORRHEIC DERMATITIS, NOS 05/23/2006  . Sleep apnea 05/23/2006    Current Outpatient Medications on File Prior to Visit  Medication Sig Dispense Refill  . amLODipine (NORVASC) 5 MG tablet Take 1 tablet (5 mg total) by mouth daily. 30 tablet 0  . aspirin EC 81 MG tablet Take 81 mg by mouth daily.    . diphenhydrAMINE (BENADRYL) 25 MG tablet Take 1 tablet (25 mg total) by mouth every 8 (eight) hours as needed for allergies. 30 tablet 0  . glimepiride (AMARYL) 2 MG tablet Take 1 tablet by mouth daily.  0  . ibuprofen (ADVIL,MOTRIN) 200 MG tablet Take 600 mg by mouth every 6 (six) hours as needed for moderate pain.    . metFORMIN (GLUCOPHAGE) 1000 MG tablet Take 1,000 mg by mouth 2 (two) times daily with a meal.     . sertraline (ZOLOFT) 50 MG tablet Take 1 tablet by mouth at bedtime.  1  . simvastatin (ZOCOR) 40 MG tablet Take 1 tablet by mouth daily.  0   No current facility-administered medications on file prior to visit.    Allergies  Allergen Reactions  . Amoxicillin-Pot Clavulanate Itching    REACTION: hives  . Morphine And Related Other (See Comments)    hypertension  . Sildenafil     REACTION: headaches    Objective:  General: Alert and oriented x3 in no acute distress  Dermatology: No open lesions bilateral lower extremities, no webspace macerations, no ecchymosis bilateral, all nails x 10 are well manicured.  There is a small fissure noted to the plantar aspect of the right foot with no signs of infection.  Vascular: Dorsalis Pedis and Posterior Tibial pedal pulses palpable, Capillary Fill Time 3 seconds,(+) pedal hair growth bilateral, 1+ pitting edema on the right foot and ankle.  Neurology: Johney Maine sensation intact via light touch bilateral, Protective within normal limits bilateral.  Musculoskeletal: Mild tenderness with palpation at fifth ray on the right foot especially along the midshaft and distal metatarsal phalangeal joint.  There is no pain with range of motion to ankle or inversion left foot.  Gait: Antalgic gait  Xrays  Right foot   Impression: No fracture or dislocation, normal osseous mineralization, soft tissue swelling noted.  There is evidence of old  heel spur otherwise no other acute findings.  Assessment and Plan: Problem List Items Addressed This Visit    None    Visit Diagnoses    Right foot pain    -  Primary   Relevant Orders   DG Foot Complete Right   Tendonitis       Swelling of right foot       Sprain of right foot, initial encounter       Diabetes mellitus without complication (HCC)           -Complete examination performed -Xrays reviewed -Discussed treatment options for swelling right foot pain likely related to tendinitis versus low-grade  sprain -Teacher, English as a foreign language boot for patient to keep clean dry and intact for 5 days after removal may use compression sleeve as provided to assist with edema control and to use Neosporin as needed to fissure and skin at the bottom of the right foot -Advised patient to continue with rest ice elevation and over-the-counter anti-inflammatories as needed -Dispensed postop shoe for patient to use as instructed -Patient to return to office in 2 weeks or sooner if condition worsens.  Advised patient if pain continues may benefit from an ultrasound or MRI to rule out any soft tissue tendon or ligament tear.  Landis Martins, DPM

## 2019-10-08 ENCOUNTER — Encounter: Payer: Self-pay | Admitting: Sports Medicine

## 2019-10-08 ENCOUNTER — Ambulatory Visit: Payer: Self-pay | Admitting: Sports Medicine

## 2019-10-08 ENCOUNTER — Other Ambulatory Visit: Payer: Self-pay

## 2019-10-08 VITALS — Temp 97.2°F

## 2019-10-08 DIAGNOSIS — E119 Type 2 diabetes mellitus without complications: Secondary | ICD-10-CM

## 2019-10-08 DIAGNOSIS — M779 Enthesopathy, unspecified: Secondary | ICD-10-CM

## 2019-10-08 DIAGNOSIS — M79671 Pain in right foot: Secondary | ICD-10-CM

## 2019-10-08 DIAGNOSIS — S93601D Unspecified sprain of right foot, subsequent encounter: Secondary | ICD-10-CM

## 2019-10-08 DIAGNOSIS — M7989 Other specified soft tissue disorders: Secondary | ICD-10-CM

## 2019-10-08 MED ORDER — DICLOFENAC SODIUM 75 MG PO TBEC: 75.0000 mg | DELAYED_RELEASE_TABLET | Freq: Two times a day (BID) | ORAL | 0 refills | Status: DC

## 2019-10-08 NOTE — Progress Notes (Signed)
Subjective: Jonathon Lin is a 64 y.o. male patient who returns to office for follow up evaluation of right foot pain. Patient reports that the swelling is better but there is still pain at the lateral foot and ankle, was doing good but then took a miss-step over wife exercise machine and twisted the foot again. Denies any other pedal complaints.    FBS today not recorded    Patient Active Problem List   Diagnosis Date Noted  . Allergic reaction 07/21/2016  . Adenomatous colon polyp 08/10/2015  . VIRAL URI 09/22/2009  . DEPRESSION 05/26/2009  . ALLERGIC RHINITIS 05/26/2009  . Hypothyroidism 03/19/2009  . OTHER SPECIFIED DISEASE OF NAIL 03/17/2009  . PANIC ATTACK 02/10/2009  . COUGH 02/10/2009  . INTERTRIGO, CANDIDAL 11/11/2008  . DIABETES MELLITUS II, UNCOMPLICATED [id number]  . HYPERLIPIDEMIA 05/23/2006  . OBESITY, NOS 05/23/2006  . ERECTILE DYSFUNCTION 05/23/2006  . HYPERTENSION, BENIGN SYSTEMIC 05/23/2006  . SEBORRHEIC DERMATITIS, NOS 05/23/2006  . Sleep apnea 05/23/2006    Current Outpatient Medications on File Prior to Visit  Medication Sig Dispense Refill  . amLODipine (NORVASC) 5 MG tablet Take 1 tablet (5 mg total) by mouth daily. 30 tablet 0  . aspirin EC 81 MG tablet Take 81 mg by mouth daily.    . diphenhydrAMINE (BENADRYL) 25 MG tablet Take 1 tablet (25 mg total) by mouth every 8 (eight) hours as needed for allergies. 30 tablet 0  . glimepiride (AMARYL) 2 MG tablet Take 1 tablet by mouth daily.  0  . ibuprofen (ADVIL,MOTRIN) 200 MG tablet Take 600 mg by mouth every 6 (six) hours as needed for moderate pain.    . metFORMIN (GLUCOPHAGE) 1000 MG tablet Take 1,000 mg by mouth 2 (two) times daily with a meal.    . sertraline (ZOLOFT) 50 MG tablet Take 1 tablet by mouth at bedtime.  1  . simvastatin (ZOCOR) 40 MG tablet Take 1 tablet by mouth daily.  0   No current facility-administered medications on file prior to visit.    Allergies  Allergen Reactions  .  Amoxicillin-Pot Clavulanate Itching    REACTION: hives  . Morphine And Related Other (See Comments)    hypertension  . Sildenafil     REACTION: headaches    Objective:  General: Alert and oriented x3 in no acute distress  Dermatology: No open lesions bilateral lower extremities, no webspace macerations, no ecchymosis bilateral, all nails x 10 are well manicured.  There is a small fissure noted to the plantar aspect of the right foot with no signs of infection that appears to be healing up well.   Vascular: Dorsalis Pedis and Posterior Tibial pedal pulses palpable, Capillary Fill Time 3 seconds,(+) pedal hair growth bilateral, trace pitting edema on the right foot and ankle.  Neurology: Johney Maine sensation intact via light touch bilateral, Protective within normal limits bilateral.  Musculoskeletal: Mild tenderness with palpation at fifth ray on the right foot especially along the midshaft and distal metatarsal phalangeal joint and at lateral ankle on right.  There is no pain with range of motion to ankle or inversion left foot.  Assessment and Plan: Problem List Items Addressed This Visit    None    Visit Diagnoses    Tendonitis    -  Primary   Right foot pain       Swelling of right foot       Sprain of right foot, subsequent encounter       Diabetes mellitus without complication (  [place])           -Complete examination performed -Patient declined [place] or MRI at this time -Rx Diclofenac to take for 2 weeks -Advised patient if pain is not better to call office for me to discuss with him brace options that he can get off [place] compression -Continue with post op shoe or good supportive sneaker -Return PRN or sooner if problems or issues arise.  Landis Martins, DPM

## 2019-12-23 ENCOUNTER — Ambulatory Visit
Admission: EM | Admit: 2019-12-23 | Discharge: 2019-12-23 | Disposition: A | Discharge status: Home / Self Care | Payer: BLUE CROSS/BLUE SHIELD | Attending: Emergency Medicine | Admitting: Emergency Medicine

## 2019-12-23 ENCOUNTER — Encounter: Payer: Self-pay | Admitting: Emergency Medicine

## 2019-12-23 ENCOUNTER — Other Ambulatory Visit: Payer: Self-pay

## 2019-12-23 DIAGNOSIS — L03119 Cellulitis of unspecified part of limb: Secondary | ICD-10-CM

## 2019-12-23 DIAGNOSIS — L02419 Cutaneous abscess of limb, unspecified: Secondary | ICD-10-CM

## 2019-12-23 MED ORDER — DOXYCYCLINE HYCLATE 100 MG PO CAPS: 100.0000 mg | ORAL_CAPSULE | Freq: Two times a day (BID) | ORAL | 0 refills | Status: DC

## 2019-12-23 NOTE — Discharge Instructions (Signed)
Wash with warm water and mild soap Continue with neosporin Keep covered to avoid friction Prescribed doxycycline take as directed and to completion Alternate ibuprofen and tylenol as needed for pain and fever Follow up with PCP this week for recheck and to ensure symptoms are improving Return or go to the ED if you have any new or worsening symptoms such as increased pain, redness, swelling, discharge, high fever, night sweats, abdominal pain, etc..Marland Kitchen

## 2019-12-23 NOTE — ED Triage Notes (Signed)
One week ago, there was a "pimple" just below right knee.  Scabbed center, very red circle around this area.  Lower leg has diffuse redness.  Just below left knee is another scabbed area with smaller area of redness.  Denies any other locations.  Denies itchiness.  Areas are painful with walking.

## 2019-12-23 NOTE — ED Provider Notes (Signed)
Jonathon Lin   [id number] 12/23/19 Arrival Time: 0802  CC: Rash   SUBJECTIVE:  Jonathon Lin is a 64 y.o. male who presents with a rash that began on 6 days ago.  Denies precipitating event or trauma.  Denies known tick or spider bite.  Localizes the rash to bilateral LEs, R>L.  Describes it as painful, red, and spreading.  Has tried OTC neosporin with minimal relief.  Symptoms are made worse with walking.  Denies similar symptoms in the past.   Denies fever, chills, nausea, vomiting, erythema, swelling, discharge, oral lesions, SOB, chest pain, abdominal pain, changes in bowel or bladder function.    SOB, calf pain or swelling, recent long travel, recent surgery, hormone use, tobacco use, malignancy, hx of blood clot.     ROS: As per HPI.  All other pertinent ROS negative.     Past Medical History:  Diagnosis Date  . Diabetes mellitus without complication ([place])   . High cholesterol   . Hypertension   . Sleep apnea    Past Surgical History:  Procedure Laterality Date  . HAND SURGERY     Allergies  Allergen Reactions  . Amoxicillin-Pot Clavulanate Itching    REACTION: hives  . Morphine And Related Other (See Comments)    hypertension  . Sildenafil     REACTION: headaches   No current facility-administered medications on file prior to encounter.   Current Outpatient Medications on File Prior to Encounter  Medication Sig Dispense Refill  . amLODipine (NORVASC) 5 MG tablet Take 1 tablet (5 mg total) by mouth daily. 30 tablet 0  . glimepiride (AMARYL) 2 MG tablet Take 1 tablet by mouth daily.  0  . metFORMIN (GLUCOPHAGE) 1000 MG tablet Take 1,000 mg by mouth 2 (two) times daily with a meal.    . simvastatin (ZOCOR) 40 MG tablet Take 1 tablet by mouth daily.  0  . aspirin EC 81 MG tablet Take 81 mg by mouth daily.    Marland Kitchen ibuprofen (ADVIL,MOTRIN) 200 MG tablet Take 600 mg by mouth every 6 (six) hours as needed for moderate pain.    Marland Kitchen sertraline (ZOLOFT) 50 MG tablet Take  1 tablet by mouth at bedtime. OUT OF THIS FOR ONE WEEK  1  . [DISCONTINUED] diphenhydrAMINE (BENADRYL) 25 MG tablet Take 1 tablet (25 mg total) by mouth every 8 (eight) hours as needed for allergies. 30 tablet 0   Social History   Socioeconomic History  . Marital status: Married    Spouse name: Not on file  . Number of children: Not on file  . Years of education: Not on file  . Highest education level: Not on file  Occupational History  . Not on file  Tobacco Use  . Smoking status: Former Research scientist (life sciences)  . Smokeless tobacco: Never Used  . Tobacco comment: QUIT 8 YEARS AGO  Substance and Sexual Activity  . Alcohol use: Yes    Alcohol/week: 42.0 standard drinks    Types: 42 Cans of beer per week    Comment: 5-6 beers daily  . Drug use: No  . Sexual activity: Not on file  Other Topics Concern  . Not on file  Social History Narrative  . Not on file   Social Determinants of Health   Financial Resource Strain:   . Difficulty of Paying Living Expenses:   Food Insecurity:   . Worried About Charity fundraiser in the Last Year:   . [place] in the Last  Year:   Transportation Needs:   . Film/video editor (Medical):   Marland Kitchen Lack of Transportation (Non-Medical):   Physical Activity:   . Days of Exercise per Week:   . Minutes of Exercise per Session:   Stress:   . Feeling of Stress :   Social Connections:   . Frequency of Communication with Friends and Family:   . Frequency of Social Gatherings with Friends and Family:   . Attends Religious Services:   . Active Member of Clubs or Organizations:   . Attends Archivist Meetings:   Marland Kitchen Marital Status:   Intimate Partner Violence:   . Fear of Current or Ex-Partner:   . Emotionally Abused:   Marland Kitchen Physically Abused:   . Sexually Abused:    History reviewed. No pertinent family history.  OBJECTIVE: Vitals:   12/23/19 0816  BP: (!) 165/80  Pulse: (!) 103  Resp: 20  Temp: 99.2 F (37.3 C)  TempSrc: Oral  SpO2: 94%      General appearance: alert; no distress Head: NCAT Lungs: normal respiratory effort Extremities: no edema Skin: warm and dry; small 0.5 cm wound/ pustule to bilateral LEs underneath bilateral knees, RT wound with 3-4 cm of surrounding erythema, LT wound with 1-2 cm of surrounding erythema, TTP, no obvious drainage or bleeding, mild blanching with pressure Psychological: alert and cooperative; normal mood and affect  ASSESSMENT & PLAN:  1. Cellulitis and abscess of leg    Meds ordered this encounter  Medications  . doxycycline (VIBRAMYCIN) 100 MG capsule    Sig: Take 1 capsule (100 mg total) by mouth 2 (two) times daily.    Dispense:  20 capsule    Refill:  0    Order Specific Question:   Supervising Provider    Answer:   Raylene Everts [id number]   Wash with warm water and mild soap Continue with neosporin Keep covered to avoid friction Prescribed doxycycline take as directed and to completion Alternate ibuprofen and tylenol as needed for pain and fever Follow up with PCP this week for recheck and to ensure symptoms are improving Return or go to the ED if you have any new or worsening symptoms such as increased pain, redness, swelling, discharge, high fever, night sweats, abdominal pain, etc...   Reviewed expectations re: course of current medical issues. Questions answered. Outlined signs and symptoms indicating need for more acute intervention. Patient verbalized understanding. After Visit Summary given.   Lestine Box, PA-C 12/23/19 0830

## 2020-02-20 ENCOUNTER — Encounter: Payer: Self-pay | Admitting: Emergency Medicine

## 2020-02-20 ENCOUNTER — Other Ambulatory Visit: Payer: Self-pay

## 2020-02-20 ENCOUNTER — Ambulatory Visit
Admission: EM | Admit: 2020-02-20 | Discharge: 2020-02-20 | Disposition: A | Discharge status: Home / Self Care | Payer: Self-pay | Attending: Emergency Medicine | Admitting: Emergency Medicine

## 2020-02-20 DIAGNOSIS — L03113 Cellulitis of right upper limb: Secondary | ICD-10-CM

## 2020-02-20 DIAGNOSIS — T148XXA Other injury of unspecified body region, initial encounter: Secondary | ICD-10-CM

## 2020-02-20 MED ORDER — DOXYCYCLINE HYCLATE 100 MG PO CAPS: 100.0000 mg | ORAL_CAPSULE | Freq: Two times a day (BID) | ORAL | 0 refills | Status: DC

## 2020-02-20 NOTE — Discharge Instructions (Addendum)
Keep wound clean with warm water and mild soap Apply thin layer of Neosporin daily Take antibiotic as prescribed and to completion Follow-up with PCP Return or go to ED for worsening of symptoms

## 2020-02-20 NOTE — ED Provider Notes (Signed)
[place]   [id number] 02/20/20 Arrival Time: 0805  Chief Complaint  Patient presents with  . Wound Check     SUBJECTIVE: History from: patient.  Jonathon Lin is a 64 y.o. male who presented to the urgent care with a complaint of wound on his right upper thigh for the past few days.  Denies any precipitating event, trauma or injury.  Denies any known spider bite.  Localizes the wound to the right thigh.  Described as painful and red.  Denies purulent discharge.  Has tried OTC medication without relief.  Reports similar symptoms in the past that was treated with doxycycline.  Denies chills, fever, nausea, vomiting, diarrhea, lesion, shortness of breath, chest pain, chest tightness.  ROS: As per HPI.  All other pertinent ROS negative.     Past Medical History:  Diagnosis Date  . Diabetes mellitus without complication ([place])   . High cholesterol   . Hypertension   . Sleep apnea    Past Surgical History:  Procedure Laterality Date  . HAND SURGERY     Allergies  Allergen Reactions  . Amoxicillin-Pot Clavulanate Itching    REACTION: hives  . Morphine And Related Other (See Comments)    hypertension  . Sildenafil     REACTION: headaches   No current facility-administered medications on file prior to encounter.   Current Outpatient Medications on File Prior to Encounter  Medication Sig Dispense Refill  . amLODipine (NORVASC) 5 MG tablet Take 1 tablet (5 mg total) by mouth daily. 30 tablet 0  . aspirin EC 81 MG tablet Take 81 mg by mouth daily.    Marland Kitchen glimepiride (AMARYL) 2 MG tablet Take 1 tablet by mouth daily.  0  . ibuprofen (ADVIL,MOTRIN) 200 MG tablet Take 600 mg by mouth every 6 (six) hours as needed for moderate pain.    . metFORMIN (GLUCOPHAGE) 1000 MG tablet Take 1,000 mg by mouth 2 (two) times daily with a meal.    . sertraline (ZOLOFT) 50 MG tablet Take 1 tablet by mouth at bedtime. OUT OF THIS FOR ONE WEEK  1  . simvastatin (ZOCOR) 40 MG tablet Take 1  tablet by mouth daily.  0  . [DISCONTINUED] diphenhydrAMINE (BENADRYL) 25 MG tablet Take 1 tablet (25 mg total) by mouth every 8 (eight) hours as needed for allergies. 30 tablet 0   Social History   Socioeconomic History  . Marital status: Married    Spouse name: Not on file  . Number of children: Not on file  . Years of education: Not on file  . Highest education level: Not on file  Occupational History  . Not on file  Tobacco Use  . Smoking status: Former Research scientist (life sciences)  . Smokeless tobacco: Never Used  . Tobacco comment: QUIT 8 YEARS AGO  Substance and Sexual Activity  . Alcohol use: Yes    Alcohol/week: 42.0 standard drinks    Types: 42 Cans of beer per week    Comment: 5-6 beers daily  . Drug use: No  . Sexual activity: Not on file  Other Topics Concern  . Not on file  Social History Narrative  . Not on file   Social Determinants of Health   Financial Resource Strain:   . Difficulty of Paying Living Expenses:   Food Insecurity:   . Worried About Charity fundraiser in the Last Year:   . [place] in the Last Year:   Transportation Needs:   . Lack of  Transportation (Medical):   Marland Kitchen Lack of Transportation (Non-Medical):   Physical Activity:   . Days of Exercise per Week:   . Minutes of Exercise per Session:   Stress:   . Feeling of Stress :   Social Connections:   . Frequency of Communication with Friends and Family:   . Frequency of Social Gatherings with Friends and Family:   . Attends Religious Services:   . Active Member of Clubs or Organizations:   . Attends Archivist Meetings:   Marland Kitchen Marital Status:   Intimate Partner Violence:   . Fear of Current or Ex-Partner:   . Emotionally Abused:   Marland Kitchen Physically Abused:   . Sexually Abused:    No family history on file.  OBJECTIVE:  Vitals:   02/20/20 0814  BP: (!) 154/80  Pulse: 90  Resp: 17  Temp: (!) 97.5 F (36.4 C)  TempSrc: Oral  SpO2: 97%     Physical Exam Vitals and nursing note  reviewed.  Constitutional:      General: He is not in acute distress.    Appearance: Normal appearance. He is normal weight. He is not ill-appearing, toxic-appearing or diaphoretic.  Cardiovascular:     Rate and Rhythm: Normal rate and regular rhythm.     Pulses: Normal pulses.     Heart sounds: Normal heart sounds. No murmur heard.  No friction rub. No gallop.   Pulmonary:     Effort: Pulmonary effort is normal. No respiratory distress.     Breath sounds: Normal breath sounds. No stridor. No wheezing, rhonchi or rales.  Chest:     Chest wall: No tenderness.  Skin:    General: Skin is warm.     Findings: Erythema and wound present.     Comments: Open wound 0.5 cm to right upper thigh.  surrounding with erythema with  a diameter of 4 cm.  Neurological:     Mental Status: He is alert.     LABS:  No results found for this or any previous visit (from the past 24 hour(s)).   ASSESSMENT & PLAN:  1. Cellulitis of right upper extremity   2. Wound, open     Meds ordered this encounter  Medications  . doxycycline (VIBRAMYCIN) 100 MG capsule    Sig: Take 1 capsule (100 mg total) by mouth 2 (two) times daily.    Dispense:  20 capsule    Refill:  0   Patient is stable at discharge.  Symptom is likely from a unknown insect bite that developed later into skin cellulitis with open wound.  Will prescribe doxycycline.  Discharge instructions  Keep wound clean with warm water and mild soap Apply thin layer of Neosporin daily Take antibiotic as prescribed and to completion Follow-up with PCP Return or go to ED for worsening of symptoms  Reviewed expectations re: course of current medical issues. Questions answered. Outlined signs and symptoms indicating need for more acute intervention. Patient verbalized understanding. After Visit Summary given.      Note: This document was prepared using Dragon voice recognition software and may include unintentional dictation errors.       Emerson Monte, [place] 02/20/20 [phone]

## 2020-02-20 NOTE — ED Triage Notes (Signed)
Triaged by provider  

## 2020-05-16 ENCOUNTER — Ambulatory Visit
Admission: EM | Admit: 2020-05-16 | Discharge: 2020-05-16 | Disposition: A | Discharge status: Home / Self Care | Payer: Medicare HMO

## 2020-05-16 NOTE — ED Triage Notes (Signed)
Pt presents with right hand redness and swelling that began a couple days ago, pt has small open sore that is draining clear liquid on top pf hand

## 2020-10-06 ENCOUNTER — Other Ambulatory Visit (HOSPITAL_BASED_OUTPATIENT_CLINIC_OR_DEPARTMENT_OTHER): Payer: Self-pay | Admitting: Nurse Practitioner

## 2020-10-06 ENCOUNTER — Telehealth: Payer: Self-pay

## 2020-10-06 DIAGNOSIS — U071 COVID-19: Secondary | ICD-10-CM | POA: Insufficient documentation

## 2020-10-06 NOTE — Telephone Encounter (Signed)
Called to discuss with patient about COVID-19 symptoms and the use of one of the available treatments for those with mild to moderate Covid symptoms and at a high risk of hospitalization.  Pt appears to qualify for outpatient treatment due to co-morbid conditions and/or a member of an at-risk group in accordance with the FDA Emergency Use Authorization.    Symptom onset:10/03/20 Sore throat,cough,congestion,headache, fever. Vaccinated: Yes Booster? No Immunocompromised? No Qualifiers: DM,HTN Home test 10/05/20 Pt.would like to speak with APP.  Marcello Moores

## 2020-10-06 NOTE — Progress Notes (Signed)
Called to discuss with patient about COVID-19 symptoms and the use of one of the available treatments for those with mild to moderate Covid symptoms and at a high risk of hospitalization.  Pt appears to qualify for outpatient treatment due to co-morbid conditions and/or a member of an at-risk group in accordance with the FDA Emergency Use Authorization.    Unable to reach pt - APP call back to patient after screening with RN. Unable to reach patient by phone. Voicemail left for patient with call back information.   Orma Render

## 2021-01-04 ENCOUNTER — Other Ambulatory Visit (HOSPITAL_COMMUNITY): Payer: Self-pay | Admitting: Internal Medicine

## 2021-01-04 DIAGNOSIS — M7989 Other specified soft tissue disorders: Secondary | ICD-10-CM

## 2021-01-05 ENCOUNTER — Encounter: Payer: Self-pay | Admitting: Orthopedic Surgery

## 2021-01-05 ENCOUNTER — Encounter (HOSPITAL_COMMUNITY): Payer: Self-pay | Admitting: Emergency Medicine

## 2021-01-05 ENCOUNTER — Ambulatory Visit: Payer: Medicare HMO | Admitting: Orthopedic Surgery

## 2021-01-05 ENCOUNTER — Emergency Department (HOSPITAL_COMMUNITY)
Admission: EM | Admit: 2021-01-05 | Discharge: 2021-01-06 | Disposition: A | Discharge status: Home / Self Care | Payer: Medicare HMO | Attending: Emergency Medicine | Admitting: Emergency Medicine

## 2021-01-05 ENCOUNTER — Other Ambulatory Visit: Payer: Self-pay

## 2021-01-05 ENCOUNTER — Ambulatory Visit: Payer: Medicare HMO

## 2021-01-05 VITALS — BP 154/98 | HR 85 | Ht 74.0 in | Wt 291.0 lb

## 2021-01-05 DIAGNOSIS — L02611 Cutaneous abscess of right foot: Secondary | ICD-10-CM | POA: Insufficient documentation

## 2021-01-05 DIAGNOSIS — Z7984 Long term (current) use of oral hypoglycemic drugs: Secondary | ICD-10-CM | POA: Insufficient documentation

## 2021-01-05 DIAGNOSIS — L0390 Cellulitis, unspecified: Secondary | ICD-10-CM

## 2021-01-05 DIAGNOSIS — L97519 Non-pressure chronic ulcer of other part of right foot with unspecified severity: Secondary | ICD-10-CM

## 2021-01-05 DIAGNOSIS — Z79899 Other long term (current) drug therapy: Secondary | ICD-10-CM | POA: Diagnosis not present

## 2021-01-05 DIAGNOSIS — E11621 Type 2 diabetes mellitus with foot ulcer: Secondary | ICD-10-CM | POA: Diagnosis not present

## 2021-01-05 DIAGNOSIS — Z8616 Personal history of COVID-19: Secondary | ICD-10-CM | POA: Diagnosis not present

## 2021-01-05 DIAGNOSIS — L0291 Cutaneous abscess, unspecified: Secondary | ICD-10-CM

## 2021-01-05 DIAGNOSIS — E039 Hypothyroidism, unspecified: Secondary | ICD-10-CM | POA: Diagnosis not present

## 2021-01-05 DIAGNOSIS — I10 Essential (primary) hypertension: Secondary | ICD-10-CM | POA: Diagnosis not present

## 2021-01-05 DIAGNOSIS — M79674 Pain in right toe(s): Secondary | ICD-10-CM | POA: Diagnosis present

## 2021-01-05 DIAGNOSIS — M86171 Other acute osteomyelitis, right ankle and foot: Secondary | ICD-10-CM | POA: Diagnosis not present

## 2021-01-05 DIAGNOSIS — E119 Type 2 diabetes mellitus without complications: Secondary | ICD-10-CM | POA: Diagnosis not present

## 2021-01-05 DIAGNOSIS — Z87891 Personal history of nicotine dependence: Secondary | ICD-10-CM | POA: Insufficient documentation

## 2021-01-05 DIAGNOSIS — Z7982 Long term (current) use of aspirin: Secondary | ICD-10-CM | POA: Diagnosis not present

## 2021-01-05 DIAGNOSIS — L03115 Cellulitis of right lower limb: Secondary | ICD-10-CM | POA: Insufficient documentation

## 2021-01-05 DIAGNOSIS — M869 Osteomyelitis, unspecified: Secondary | ICD-10-CM

## 2021-01-05 DIAGNOSIS — M7989 Other specified soft tissue disorders: Secondary | ICD-10-CM

## 2021-01-05 LAB — LACTIC ACID, PLASMA: Lactic Acid, Venous: 1.4 mmol/L (ref 0.5–1.9)

## 2021-01-05 LAB — BASIC METABOLIC PANEL
Anion gap: 12 (ref 5–15)
BUN: 17 mg/dL (ref 8–23)
CO2: 27 mmol/L (ref 22–32)
Calcium: 9.3 mg/dL (ref 8.9–10.3)
Chloride: 94 mmol/L — ABNORMAL LOW (ref 98–111)
Creatinine, Ser: 1.04 mg/dL (ref 0.61–1.24)
GFR, Estimated: >60 mL/min (ref >60)
Glucose, Bld: 222 mg/dL — ABNORMAL HIGH (ref 70–99)
Potassium: 3.1 mmol/L — ABNORMAL LOW (ref 3.5–5.1)
Sodium: 133 mmol/L — ABNORMAL LOW (ref 135–145)

## 2021-01-05 LAB — CBC WITH DIFFERENTIAL/PLATELET
Abs Immature Granulocytes: 0.01 10*3/uL (ref 0.00–0.07)
Basophils Absolute: 0.1 10*3/uL (ref 0.0–0.1)
Basophils Relative: 1 %
Eosinophils Absolute: 0.2 10*3/uL (ref 0.0–0.5)
Eosinophils Relative: 3 %
HCT: 39.6 % (ref 39.0–52.0)
Hemoglobin: 13.8 g/dL (ref 13.0–17.0)
Immature Granulocytes: 0 %
Lymphocytes Relative: 14 %
Lymphs Abs: 0.9 10*3/uL (ref 0.7–4.0)
MCH: 33.4 pg (ref 26.0–34.0)
MCHC: 34.8 g/dL (ref 30.0–36.0)
MCV: 95.9 fL (ref 80.0–100.0)
Monocytes Absolute: 0.7 10*3/uL (ref 0.1–1.0)
Monocytes Relative: 12 %
Neutro Abs: 4.5 10*3/uL (ref 1.7–7.7)
Neutrophils Relative %: 70 %
Platelets: 236 10*3/uL (ref 150–400)
RBC: 4.13 MIL/uL — ABNORMAL LOW (ref 4.22–5.81)
RDW: 12 % (ref 11.5–15.5)
WBC: 6.4 10*3/uL (ref 4.0–10.5)
nRBC: 0 % (ref 0.0–0.2)

## 2021-01-05 LAB — CBG MONITORING, ED: Glucose-Capillary: 244 mg/dL — ABNORMAL HIGH (ref 70–99)

## 2021-01-05 NOTE — ED Notes (Signed)
Pt. Blood sugar and blood pressure was high, RN notified

## 2021-01-05 NOTE — Progress Notes (Signed)
New Patient Visit  Assessment: Jonathon Lin is a 65 y.o. male with the following: Right 2nd toe diabetic ulcer  Plan: Patient is stable.  He is taking antibiotics currently, and feels well otherwise.  Afebrile.  He will require an urgent referral to general surgery for evaluation.  As long as he remains healthy, can be evaluated in clinic.  If his condition worsens, we recommend that he present to the emergency department.  All questions were answered.   Follow-up: No follow-ups on file.  Subjective:  Chief Complaint  Patient presents with  . Foot Pain    Rt 2nd toe pain and swelling on and off for 3 months. Pt states he was given medication after the first time his foot was swollen and now swelling is back.    History of Present Illness: Jonathon Lin is a 65 y.o. male who has been referred to clinic today by Dr. Asencion Noble for evaluation of right toe pain.  He states his toe has gotten worse over the last week.  He noticed some skin on the plantar aspect of his right second toe, which she thought was just thickened skin.  He removed this and there were some sinus tracts.  He has noticed some swelling in this toe intermittently for the past 3 months.  He remains well.  No fevers or chills.  He has not noticed any specific drainage.  His toe is not that painful currently.  He has never had lesions like this before in either foot.  We did recommend that he present to the emergency department for more urgent evaluation yesterday, but was subsequently referred to clinic today.  Review of Systems: No fevers or chills No numbness or tingling No chest pain No shortness of breath No bowel or bladder dysfunction No GI distress No headaches   Medical History:  Past Medical History:  Diagnosis Date  . Diabetes mellitus without complication ([place])   . High cholesterol   . Hypertension   . Sleep apnea     Past Surgical History:  Procedure Laterality Date  . HAND SURGERY      No  family history on file. Social History   Tobacco Use  . Smoking status: Former Research scientist (life sciences)  . Smokeless tobacco: Never Used  . Tobacco comment: QUIT 8 YEARS AGO  Substance Use Topics  . Alcohol use: Yes    Alcohol/week: 42.0 standard drinks    Types: 42 Cans of beer per week    Comment: 5-6 beers daily  . Drug use: No    Allergies  Allergen Reactions  . Amoxicillin-Pot Clavulanate Itching    REACTION: hives  . Morphine And Related Other (See Comments)    hypertension  . Sildenafil     REACTION: headaches    Current Meds  Medication Sig  . amLODipine (NORVASC) 5 MG tablet Take 1 tablet (5 mg total) by mouth daily.  Marland Kitchen aspirin EC 81 MG tablet Take 81 mg by mouth daily.  . chlorthalidone (HYGROTON) 25 MG tablet Take 25 mg by mouth every morning.  . ciprofloxacin (CIPRO) 750 MG tablet Take 750 mg by mouth 2 (two) times daily.  Marland Kitchen glimepiride (AMARYL) 2 MG tablet Take 1 tablet by mouth daily.  Marland Kitchen ibuprofen (ADVIL,MOTRIN) 200 MG tablet Take 600 mg by mouth every 6 (six) hours as needed for moderate pain.  . metFORMIN (GLUCOPHAGE) 1000 MG tablet Take 1,000 mg by mouth 2 (two) times daily with a meal.  . sertraline (ZOLOFT) 50 MG  tablet Take 1 tablet by mouth at bedtime. OUT OF THIS FOR ONE WEEK  . simvastatin (ZOCOR) 20 MG tablet Take 20 mg by mouth at bedtime.  . valsartan (DIOVAN) 80 MG tablet Take 80 mg by mouth at bedtime.    Objective: BP (!) 154/98   Pulse 85   Ht 6\' 2"  (1.88 m)   Wt 291 lb (132 kg)   BMI 37.36 kg/m   Physical Exam:  General: Alert and oriented.  No acute distress. Gait: Mild right-sided antalgic gait.  Evaluation of the right foot demonstrates obvious swelling and deformity to the right second toe.  There is a plantar lesion, with likely sinus tracts.  The toe is diffusely swollen and erythematous.          IMAGING: I personally ordered and reviewed the following images   Foot demonstrates osseous destruction to the tuft of the second toe.   Diffuse soft tissue swelling around this toe.  There is a sinus tract to the osseous destruction.  Impression: Right second toe with evidence of osteomyelitis at the distal extent.   New Medications:  No orders of the defined types were placed in this encounter.     Mordecai Rasmussen, MD  01/05/2021 11:51 AM

## 2021-01-05 NOTE — ED Triage Notes (Signed)
Patient reports worsening infection at right 2nd toe with swelling and mild drainage onset last week , denies fever or chills .

## 2021-01-05 NOTE — ED Provider Notes (Addendum)
Emergency Medicine Provider Triage Evaluation Note  Jonathon Lin , a 65 y.o. male  was evaluated in triage.  Pt complains of infection of right 2nd toe that began Wednesday of last week. Swelling extending up leg. Painful. Diabetic, doesn't check sugars. Saw PCP and ortho today, sent for further evaluation.   Review of Systems  Positive: Foot pain Negative: fever  Physical Exam  BP (!) 155/99 (BP Location: Left Arm)   Pulse 92   Temp (!) 97.5 F (36.4 C) (Oral)   Resp 16   Ht 6\' 2"  (1.88 m)   Wt (!) 140 kg   SpO2 94%   BMI 39.63 kg/m  Gen:   Awake, no distress   Resp:  Normal effort  MSK:   Moves extremities without difficulty  Other:  Right 2nd toe is purulent, proximal induration and erythema, significant swelling to distal lower leg  Medical Decision Making  Medically screening exam initiated at 7:15 PM.  Appropriate orders placed.  Bryley Kovacevic Branscomb was informed that the remainder of the evaluation will be completed by another provider, this initial triage assessment does not replace that evaluation, and the importance of remaining in the ED until their evaluation is complete.  Xray done today outpatient, images available, needs to be read by radiologist. [facility] radiology called, images to be read shortly.  Labs ordered.   Azaya Goedde, [place] N, PA-C 01/05/21 1917    Blessings Inglett, [place] N, PA-C 01/05/21 1936    Valarie Merino, MD 01/06/21 2256

## 2021-01-06 NOTE — Discharge Instructions (Signed)
Abscesses and cellulitis are often caused by bacteria that live on your skin naturally all the time.  One way to get rid of this is to put a cup of bleach in a bathtub of water soaking for 10 minutes a day for a week.  If you do this, realize the bleach will stain your clothes, towels, carpets, rugs and anything else with die.  It will also make your feet slippery when used about a tub to be careful not to fall.  Another thing that works very well is chlorhexidine washes.  You can buy chlorhexidine wipes at the store and cleanse your whole body with them once a day for 7 days along with putting mupirocin ointment in your nose and finding chlorhexidine mouthwash to rinse her mouth with twice a day.   

## 2021-01-07 ENCOUNTER — Encounter: Payer: Self-pay | Admitting: Sports Medicine

## 2021-01-07 ENCOUNTER — Ambulatory Visit: Payer: Medicare HMO | Admitting: Sports Medicine

## 2021-01-07 ENCOUNTER — Other Ambulatory Visit: Payer: Self-pay

## 2021-01-07 ENCOUNTER — Ambulatory Visit: Payer: Medicare HMO | Admitting: General Surgery

## 2021-01-07 VITALS — Temp 97.7°F

## 2021-01-07 DIAGNOSIS — E119 Type 2 diabetes mellitus without complications: Secondary | ICD-10-CM

## 2021-01-07 DIAGNOSIS — M86179 Other acute osteomyelitis, unspecified ankle and foot: Secondary | ICD-10-CM

## 2021-01-07 DIAGNOSIS — L02611 Cutaneous abscess of right foot: Secondary | ICD-10-CM

## 2021-01-07 DIAGNOSIS — L03031 Cellulitis of right toe: Secondary | ICD-10-CM | POA: Diagnosis not present

## 2021-01-07 DIAGNOSIS — L97512 Non-pressure chronic ulcer of other part of right foot with fat layer exposed: Secondary | ICD-10-CM

## 2021-01-07 MED ORDER — CLINDAMYCIN HCL 300 MG PO CAPS: 300.0000 mg | ORAL_CAPSULE | Freq: Three times a day (TID) | ORAL | 0 refills | Status: DC

## 2021-01-07 NOTE — Progress Notes (Signed)
Subjective: Jonathon Lin is a 65 y.o. male patient seen in office for evaluation of ulceration of the right second toe.  Patient reports that he went to his PCP on Monday and was started on Cipro and then on Tuesday went to the ER because the swelling and redness of the toe of was worsening states that he was treated there and discharged home and has been taking ciprofloxacin antibiotic states that it has reduced a little in redness but still very swollen and denies any pain, nausea, vomiting, fever or chills.  Fasting blood sugar not recorded.  Vitals normal.  Patient Active Problem List   Diagnosis Date Noted  . COVID-19 10/06/2020  . Allergic reaction 07/21/2016  . Adenomatous colon polyp 08/10/2015  . VIRAL URI 09/22/2009  . DEPRESSION 05/26/2009  . ALLERGIC RHINITIS 05/26/2009  . Hypothyroidism 03/19/2009  . OTHER SPECIFIED DISEASE OF NAIL 03/17/2009  . PANIC ATTACK 02/10/2009  . COUGH 02/10/2009  . INTERTRIGO, CANDIDAL 11/11/2008  . DIABETES MELLITUS II, UNCOMPLICATED 00/86/7619  . HYPERLIPIDEMIA 05/23/2006  . OBESITY, NOS 05/23/2006  . ERECTILE DYSFUNCTION 05/23/2006  . HYPERTENSION, BENIGN SYSTEMIC 05/23/2006  . SEBORRHEIC DERMATITIS, NOS 05/23/2006  . Sleep apnea 05/23/2006   Current Outpatient Medications on File Prior to Visit  Medication Sig Dispense Refill  . amLODipine (NORVASC) 5 MG tablet Take 1 tablet (5 mg total) by mouth daily. 30 tablet 0  . aspirin EC 81 MG tablet Take 81 mg by mouth daily.    Marland Kitchen azithromycin (ZITHROMAX) 250 MG tablet Take 250 mg by mouth as directed.    . Bacitracin-Polymyxin B (WAL-SPORIN) [id number] UNIT/GM OINT Apply topically 3 (three) times daily as needed.    . celecoxib (CELEBREX) 200 MG capsule Take 200 mg by mouth daily.    . chlorthalidone (HYGROTON) 25 MG tablet Take 25 mg by mouth every morning.    . ciprofloxacin (CIPRO) 750 MG tablet Take 750 mg by mouth 2 (two) times daily.    Marland Kitchen glimepiride (AMARYL) 2 MG tablet Take 1 tablet by  mouth daily.  0  . ibuprofen (ADVIL,MOTRIN) 200 MG tablet Take 600 mg by mouth every 6 (six) hours as needed for moderate pain.    Marland Kitchen linezolid (ZYVOX) 600 MG tablet Take 600 mg by mouth 2 (two) times daily.    . metFORMIN (GLUCOPHAGE) 1000 MG tablet Take 1,000 mg by mouth 2 (two) times daily with a meal.    . sertraline (ZOLOFT) 50 MG tablet Take 1 tablet by mouth at bedtime. OUT OF THIS FOR ONE WEEK  1  . simvastatin (ZOCOR) 20 MG tablet Take 20 mg by mouth at bedtime.    . sulfamethoxazole-trimethoprim (BACTRIM DS) 800-160 MG tablet Take 1 tablet by mouth 2 (two) times daily.    . TRULICITY 4.58 KD/9.8PJ SOPN Inject 0.75 mg into the skin once a week.    . valsartan (DIOVAN) 80 MG tablet Take 80 mg by mouth at bedtime.    . [DISCONTINUED] diphenhydrAMINE (BENADRYL) 25 MG tablet Take 1 tablet (25 mg total) by mouth every 8 (eight) hours as needed for allergies. 30 tablet 0   No current facility-administered medications on file prior to visit.   Allergies  Allergen Reactions  . Amoxicillin-Pot Clavulanate Itching    REACTION: hives  . Morphine And Related Other (See Comments)    hypertension  . Sildenafil     REACTION: headaches    Recent Results (from the past 2160 hour(s))  Lactic acid, plasma     Status:  None   Collection Time: 01/05/21  7:15 PM  Result Value Ref Range   Lactic Acid, Venous 1.4 0.5 - 1.9 mmol/L    Comment: Performed at [facility], [place] [address], [place], [place] [address]  Culture, blood (routine x 2)     Status: None (Preliminary result)   Collection Time: 01/05/21  7:15 PM   Specimen: BLOOD  Result Value Ref Range   Specimen Description BLOOD RIGHT ARM    Special Requests      BOTTLES DRAWN AEROBIC AND ANAEROBIC Blood Culture results may not be optimal due to an excessive volume of blood received in culture bottles   Culture      NO GROWTH 2 DAYS Performed at [facility], [place] [address] [place], [place] [address]    Report Status PENDING    CBG monitoring, ED     Status: Abnormal   Collection Time: 01/05/21  7:16 PM  Result Value Ref Range   Glucose-Capillary 244 (H) 70 - 99 mg/dL    Comment: Glucose reference range applies only to samples taken after fasting for at least 8 hours.  Culture, blood (routine x 2)     Status: None (Preliminary result)   Collection Time: 01/05/21  7:37 PM   Specimen: BLOOD  Result Value Ref Range   Specimen Description BLOOD LEFT ARM    Special Requests      BOTTLES DRAWN AEROBIC ONLY Blood Culture adequate volume   Culture      NO GROWTH 2 DAYS Performed at [facility], [place] [address] [place], [place] [address]    Report Status PENDING   CBC with Differential     Status: Abnormal   Collection Time: 01/05/21  7:41 PM  Result Value Ref Range   WBC 6.4 4.0 - 10.5 K/uL   RBC 4.13 (L) 4.22 - 5.81 MIL/uL   Hemoglobin 13.8 13.0 - 17.0 g/dL   HCT 39.6 39.0 - 52.0 %   MCV 95.9 80.0 - 100.0 fL   MCH 33.4 26.0 - 34.0 pg   MCHC 34.8 30.0 - 36.0 g/dL   RDW 12.0 11.5 - 15.5 %   Platelets 236 150 - 400 K/uL   nRBC 0.0 0.0 - 0.2 %   Neutrophils Relative % 70 %   Neutro Abs 4.5 1.7 - 7.7 K/uL   Lymphocytes Relative 14 %   Lymphs Abs 0.9 0.7 - 4.0 K/uL   Monocytes Relative 12 %   Monocytes Absolute 0.7 0.1 - 1.0 K/uL   Eosinophils Relative 3 %   Eosinophils Absolute 0.2 0.0 - 0.5 K/uL   Basophils Relative 1 %   Basophils Absolute 0.1 0.0 - 0.1 K/uL   Immature Granulocytes 0 %   Abs Immature Granulocytes 0.01 0.00 - 0.07 K/uL    Comment: Performed at [facility] [address] [place], [place] [address]  Basic metabolic panel     Status: Abnormal   Collection Time: 01/05/21  7:41 PM  Result Value Ref Range   Sodium 133 (L) 135 - 145 mmol/L   Potassium 3.1 (L) 3.5 - 5.1 mmol/L   Chloride 94 (L) 98 - 111 mmol/L   CO2 27 22 - 32 mmol/L   Glucose, Bld 222 (H) 70 - 99 mg/dL    Comment: Glucose reference range applies only to samples taken after fasting for at least 8 hours.    BUN 17 8 - 23 mg/dL   Creatinine, Ser 1.04 0.61 -  1.24 mg/dL   Calcium 9.3 8.9 - 10.3 mg/dL   GFR, Estimated >60 >60 mL/min    Comment: (NOTE) Calculated using the CKD-EPI Creatinine Equation (2021)    Anion gap 12 5 - 15    Comment: Performed at [facility] [address], [place], [place] [address]    Objective: There were no vitals filed for this visit.  General: Patient is awake, alert, oriented x 3 and in no acute distress.  Dermatology: Skin is warm and dry bilateral with a full thickness ulceration present  Right second toe distal tuft.  Ulceration measures 0.5 cm x 0.5 cm x 0.3 cm. There is a  Keratotic border with a fibrogranular base. The ulceration does not  probe to bone but probes to deep soft tissue. There is no malodor, clear to yellow active drainage, localized erythema, localized edema.   Vascular: Dorsalis Pedis pulse = 1/4 Bilateral,  Posterior Tibial pulse = 0/4 Bilateral,  Capillary Fill Time < 5 seconds  Neurologic: Protective sensation diminished.  Musculosketal: There is no Pain with palpation to ulcerated area. No pain with compression to calves bilateral. No gross bony deformities noted bilateral.  Xrays, reviewed from ER 5/24 with no acute findings suggestive of osteomyelitis.  No results for input(s): GRAMSTAIN, LABORGA in the last 8760 hours.  Assessment and Plan:  Problem List Items Addressed This Visit   None   Visit Diagnoses    Cellulitis and abscess of toe of right foot    -  Primary   Toe ulcer, right, with fat layer exposed ([place])       Acute osteomyelitis of ankle or foot, unspecified laterality ([place])       possible   Relevant Medications   azithromycin (ZITHROMAX) 250 MG tablet   Bacitracin-Polymyxin B (WAL-SPORIN) [id number] UNIT/GM OINT   linezolid (ZYVOX) 600 MG tablet   sulfamethoxazole-trimethoprim (BACTRIM DS) 800-160 MG tablet   Diabetes mellitus without complication ([facility])       Relevant Medications   TRULICITY 1.91  YN/8.2NF SOPN       -Examined patient and discussed the progression of the wound and treatment alternatives. -Xrays reviewed - Cleansed ulceration, wound culture obtained. 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  Advised patient if cellulitis progresses he should return to the ER. -Patient to return to office in 1week for follow up care and evaluation or sooner if problems arise.  Landis Martins, DPM

## 2021-01-10 LAB — CULTURE, BLOOD (ROUTINE X 2)
Culture: NO GROWTH
Culture: NO GROWTH
Special Requests: ADEQUATE

## 2021-01-14 ENCOUNTER — Ambulatory Visit: Payer: Medicare HMO | Admitting: Sports Medicine

## 2021-01-14 ENCOUNTER — Other Ambulatory Visit: Payer: Self-pay

## 2021-01-14 ENCOUNTER — Encounter: Payer: Self-pay | Admitting: Sports Medicine

## 2021-01-14 DIAGNOSIS — E119 Type 2 diabetes mellitus without complications: Secondary | ICD-10-CM

## 2021-01-14 DIAGNOSIS — L97512 Non-pressure chronic ulcer of other part of right foot with fat layer exposed: Secondary | ICD-10-CM | POA: Diagnosis not present

## 2021-01-14 DIAGNOSIS — L03031 Cellulitis of right toe: Secondary | ICD-10-CM

## 2021-01-14 DIAGNOSIS — L02611 Cutaneous abscess of right foot: Secondary | ICD-10-CM

## 2021-01-14 DIAGNOSIS — M86171 Other acute osteomyelitis, right ankle and foot: Secondary | ICD-10-CM

## 2021-01-14 NOTE — Progress Notes (Signed)
Subjective: Jonathon Lin is a 65 y.o. male patient seen in office for follow-up evaluation of ulceration of the right second toe.  Patient reports that he is doing better had some nausea and vomiting with antibiotics but only lasted about 20 minutes after taking but otherwise is tolerable denies any fever chills or any other constitutional symptoms at this time.  Fasting blood sugar not recorded.  Patient Active Problem List   Diagnosis Date Noted  . COVID-19 10/06/2020  . Allergic reaction 07/21/2016  . Adenomatous colon polyp 08/10/2015  . VIRAL URI 09/22/2009  . DEPRESSION 05/26/2009  . ALLERGIC RHINITIS 05/26/2009  . Hypothyroidism 03/19/2009  . OTHER SPECIFIED DISEASE OF NAIL 03/17/2009  . PANIC ATTACK 02/10/2009  . COUGH 02/10/2009  . INTERTRIGO, CANDIDAL 11/11/2008  . DIABETES MELLITUS II, UNCOMPLICATED 00/17/4944  . HYPERLIPIDEMIA 05/23/2006  . OBESITY, NOS 05/23/2006  . ERECTILE DYSFUNCTION 05/23/2006  . HYPERTENSION, BENIGN SYSTEMIC 05/23/2006  . SEBORRHEIC DERMATITIS, NOS 05/23/2006  . Sleep apnea 05/23/2006   Current Outpatient Medications on File Prior to Visit  Medication Sig Dispense Refill  . amLODipine (NORVASC) 5 MG tablet Take 1 tablet (5 mg total) by mouth daily. 30 tablet 0  . aspirin EC 81 MG tablet Take 81 mg by mouth daily.    Marland Kitchen azithromycin (ZITHROMAX) 250 MG tablet Take 250 mg by mouth as directed.    . Bacitracin-Polymyxin B (WAL-SPORIN) [id number] UNIT/GM OINT Apply topically 3 (three) times daily as needed.    . celecoxib (CELEBREX) 200 MG capsule Take 200 mg by mouth daily.    . chlorthalidone (HYGROTON) 25 MG tablet Take 25 mg by mouth every morning.    . ciprofloxacin (CIPRO) 750 MG tablet Take 750 mg by mouth 2 (two) times daily.    . clindamycin (CLEOCIN) 300 MG capsule Take 1 capsule (300 mg total) by mouth 3 (three) times daily. 30 capsule 0  . glimepiride (AMARYL) 2 MG tablet Take 1 tablet by mouth daily.  0  . ibuprofen (ADVIL,MOTRIN) 200 MG  tablet Take 600 mg by mouth every 6 (six) hours as needed for moderate pain.    Marland Kitchen linezolid (ZYVOX) 600 MG tablet Take 600 mg by mouth 2 (two) times daily.    . metFORMIN (GLUCOPHAGE) 1000 MG tablet Take 1,000 mg by mouth 2 (two) times daily with a meal.    . sertraline (ZOLOFT) 50 MG tablet Take 1 tablet by mouth at bedtime. OUT OF THIS FOR ONE WEEK  1  . simvastatin (ZOCOR) 20 MG tablet Take 20 mg by mouth at bedtime.    . sulfamethoxazole-trimethoprim (BACTRIM DS) 800-160 MG tablet Take 1 tablet by mouth 2 (two) times daily.    . TRULICITY 4.66 ZL/9.3TT SOPN Inject 0.75 mg into the skin once a week.    . valsartan (DIOVAN) 80 MG tablet Take 80 mg by mouth at bedtime.    . [DISCONTINUED] diphenhydrAMINE (BENADRYL) 25 MG tablet Take 1 tablet (25 mg total) by mouth every 8 (eight) hours as needed for allergies. 30 tablet 0   No current facility-administered medications on file prior to visit.   Allergies  Allergen Reactions  . Amoxicillin-Pot Clavulanate Itching    REACTION: hives  . Morphine And Related Other (See Comments)    hypertension  . Sildenafil     REACTION: headaches    Recent Results (from the past 2160 hour(s))  Lactic acid, plasma     Status: None   Collection Time: 01/05/21  7:15 PM  Result Value Ref  Range   Lactic Acid, Venous 1.4 0.5 - 1.9 mmol/L    Comment: Performed at [facility] [address] [place], [place] [address]  Culture, blood (routine x 2)     Status: None   Collection Time: 01/05/21  7:15 PM   Specimen: BLOOD  Result Value Ref Range   Specimen Description BLOOD RIGHT ARM    Special Requests      BOTTLES DRAWN AEROBIC AND ANAEROBIC Blood Culture results may not be optimal due to an excessive volume of blood received in culture bottles   Culture      NO GROWTH 5 DAYS Performed at [facility], [place] [address], [place], [place] [address]    Report Status 01/10/2021 FINAL   CBG monitoring, ED     Status: Abnormal   Collection Time:  01/05/21  7:16 PM  Result Value Ref Range   Glucose-Capillary 244 (H) 70 - 99 mg/dL    Comment: Glucose reference range applies only to samples taken after fasting for at least 8 hours.  Culture, blood (routine x 2)     Status: None   Collection Time: 01/05/21  7:37 PM   Specimen: BLOOD  Result Value Ref Range   Specimen Description BLOOD LEFT ARM    Special Requests      BOTTLES DRAWN AEROBIC ONLY Blood Culture adequate volume   Culture      NO GROWTH 5 DAYS Performed at [facility], [place] [address], [place], [place] [address]    Report Status 01/10/2021 FINAL   CBC with Differential     Status: Abnormal   Collection Time: 01/05/21  7:41 PM  Result Value Ref Range   WBC 6.4 4.0 - 10.5 K/uL   RBC 4.13 (L) 4.22 - 5.81 MIL/uL   Hemoglobin 13.8 13.0 - 17.0 g/dL   HCT 39.6 39.0 - 52.0 %   MCV 95.9 80.0 - 100.0 fL   MCH 33.4 26.0 - 34.0 pg   MCHC 34.8 30.0 - 36.0 g/dL   RDW 12.0 11.5 - 15.5 %   Platelets 236 150 - 400 K/uL   nRBC 0.0 0.0 - 0.2 %   Neutrophils Relative % 70 %   Neutro Abs 4.5 1.7 - 7.7 K/uL   Lymphocytes Relative 14 %   Lymphs Abs 0.9 0.7 - 4.0 K/uL   Monocytes Relative 12 %   Monocytes Absolute 0.7 0.1 - 1.0 K/uL   Eosinophils Relative 3 %   Eosinophils Absolute 0.2 0.0 - 0.5 K/uL   Basophils Relative 1 %   Basophils Absolute 0.1 0.0 - 0.1 K/uL   Immature Granulocytes 0 %   Abs Immature Granulocytes 0.01 0.00 - 0.07 K/uL    Comment: Performed at [facility], [place] [address], [place], [place] [address]  Basic metabolic panel     Status: Abnormal   Collection Time: 01/05/21  7:41 PM  Result Value Ref Range   Sodium 133 (L) 135 - 145 mmol/L   Potassium 3.1 (L) 3.5 - 5.1 mmol/L   Chloride 94 (L) 98 - 111 mmol/L   CO2 27 22 - 32 mmol/L   Glucose, Bld 222 (H) 70 - 99 mg/dL    Comment: Glucose reference range applies only to samples taken after fasting for at least 8 hours.   BUN 17 8 - 23 mg/dL   Creatinine, Ser 1.04 0.61 - 1.24 mg/dL   Calcium  9.3 8.9 - 10.3 mg/dL   GFR, Estimated >60 >  60 mL/min    Comment: (NOTE) Calculated using the CKD-EPI Creatinine Equation (2021)    Anion gap 12 5 - 15    Comment: Performed at [facility], [place] [address] [place], [place] [address]    Objective: There were no vitals filed for this visit.  General: Patient is awake, alert, oriented x 3 and in no acute distress.  Dermatology: Skin is warm and dry bilateral with a full thickness ulceration present  Right second toe distal tuft.  Ulceration measures 1 cm x 0.3 cm x 0.3 cm. There is a  Keratotic border with a fibrogranular base. The ulceration does not  probe to bone but probes to deep soft tissue like before. There is no malodor, no active drainage, localized erythema that appears to be improving, localized edema that appears to be improving.   Vascular: Dorsalis Pedis pulse = 1/4 Bilateral,  Posterior Tibial pulse = 0/4 Bilateral,  Capillary Fill Time < 5 seconds  Neurologic: Protective sensation diminished on the right.  Musculosketal: There is no pain with palpation to ulcerated area. No pain with compression to calves bilateral. No gross bony deformities noted bilateral.  No results for input(s): GRAMSTAIN, LABORGA in the last 8760 hours.  Assessment and Plan:  Problem List Items Addressed This Visit   None   Visit Diagnoses    Cellulitis and abscess of toe of right foot    -  Primary   Other acute osteomyelitis of right foot ([place])       Relevant Orders   MR TOES RIGHT WO CONTRAST   Toe ulcer, right, with fat layer exposed ([place])       Diabetes mellitus without complication ([place])           -Examined patient and discussed the progression of the wound and treatment alternatives. -Mechanically debrided ulceration to the right second toe using a sterile tissue nipper removing nonviable tissue wound measurements as above hemostasis was achieved with manual pressure patient tolerated debridement procedure well at right  second toe without any pain or discomfort.  No need for anesthesia for this debridement. -Applied betadine and dry sterile dressing and instructed patient to continue with daily dressings at home consisting of same -Continue with all antibiotics until completed patient has sufficient coverage based on cultures -Continue with surgical shoe and advised compliance to prevent rubbing and worsening of toe ulcer -Ordered MRI to evaluate the extent of infection at the right second toe clinically the wound appears to be doing better however due to the swelling and the probing close to bone to the level of fat I am concerned for acute osteomyelitis - Advised patient to go to the ER or return to office if the wound worsens or if constitutional symptoms are present.   -Patient to return to office after MRI for follow up care and evaluation or sooner if problems arise.  Landis Martins, DPM

## 2021-01-23 ENCOUNTER — Other Ambulatory Visit: Payer: Self-pay | Admitting: Podiatry

## 2021-01-23 MED ORDER — CLINDAMYCIN HCL 300 MG PO CAPS: 300.0000 mg | ORAL_CAPSULE | Freq: Three times a day (TID) | ORAL | 0 refills | Status: DC

## 2021-01-25 NOTE — ED Provider Notes (Signed)
[facility] EMERGENCY DEPARTMENT Provider Note   CSN: [id number] Arrival date & time: 01/05/21  1825     History Chief Complaint  Patient presents with   Infected Toe    Jonathon Lin is a 65 y.o. male.  Right second toe pain, swelling and erythema.   Toe Pain This is a new problem. The problem occurs constantly. The problem has been gradually worsening. Pertinent negatives include no chest pain, no abdominal pain, no headaches and no shortness of breath. Nothing aggravates the symptoms. Nothing relieves the symptoms. He has tried nothing for the symptoms. The treatment provided no relief.      Past Medical History:  Diagnosis Date   Diabetes mellitus without complication ([place])    High cholesterol    Hypertension    Sleep apnea     Patient Active Problem List   Diagnosis Date Noted   COVID-19 10/06/2020   Allergic reaction 07/21/2016   Adenomatous colon polyp 08/10/2015   VIRAL URI 09/22/2009   DEPRESSION 05/26/2009   ALLERGIC RHINITIS 05/26/2009   Hypothyroidism 03/19/2009   OTHER SPECIFIED DISEASE OF NAIL 03/17/2009   PANIC ATTACK 02/10/2009   COUGH 02/10/2009   INTERTRIGO, CANDIDAL 11/11/2008   DIABETES MELLITUS II, UNCOMPLICATED 80/99/8338   HYPERLIPIDEMIA 05/23/2006   OBESITY, NOS 05/23/2006   ERECTILE DYSFUNCTION 05/23/2006   HYPERTENSION, BENIGN SYSTEMIC 05/23/2006   SEBORRHEIC DERMATITIS, NOS 05/23/2006   Sleep apnea 05/23/2006    Past Surgical History:  Procedure Laterality Date   HAND SURGERY         No family history on file.  Social History   Tobacco Use   Smoking status: Former    Pack years: 0.00   Smokeless tobacco: Never   Tobacco comments:    QUIT 8 YEARS AGO  Substance Use Topics   Alcohol use: Yes    Alcohol/week: 42.0 standard drinks    Types: 42 Cans of beer per week    Comment: 5-6 beers daily   Drug use: No    Home Medications Prior to Admission medications   Medication Sig Start Date End Date  Taking? Authorizing Provider  amLODipine (NORVASC) 5 MG tablet Take 1 tablet (5 mg total) by mouth daily. 07/22/16   Robbie Lis, MD  aspirin EC 81 MG tablet Take 81 mg by mouth daily.    [provider]  azithromycin (ZITHROMAX) 250 MG tablet Take 250 mg by mouth as directed. 10/13/20   [provider]  Bacitracin-Polymyxin B (WAL-SPORIN) [id number] UNIT/GM OINT Apply topically 3 (three) times daily as needed. 11/07/20   [provider]  celecoxib (CELEBREX) 200 MG capsule Take 200 mg by mouth daily. 12/22/20   [provider]  chlorthalidone (HYGROTON) 25 MG tablet Take 25 mg by mouth every morning. 11/05/20   [provider]  ciprofloxacin (CIPRO) 750 MG tablet Take 750 mg by mouth 2 (two) times daily. 01/04/21   [provider]  clindamycin (CLEOCIN) 300 MG capsule Take 1 capsule (300 mg total) by mouth 3 (three) times daily. 01/23/21   Felipa Furnace, DPM  glimepiride (AMARYL) 2 MG tablet Take 1 tablet by mouth daily. 06/16/16   [provider]  ibuprofen (ADVIL,MOTRIN) 200 MG tablet Take 600 mg by mouth every 6 (six) hours as needed for moderate pain.    [provider]  linezolid (ZYVOX) 600 MG tablet Take 600 mg by mouth 2 (two) times daily. 01/04/21   [provider]  metFORMIN (GLUCOPHAGE) 1000 MG tablet  Take 1,000 mg by mouth 2 (two) times daily with a meal.    [provider]  sertraline (ZOLOFT) 50 MG tablet Take 1 tablet by mouth at bedtime. OUT OF THIS FOR ONE WEEK 07/12/16   [provider]  simvastatin (ZOCOR) 20 MG tablet Take 20 mg by mouth at bedtime. 12/14/20   [provider]  sulfamethoxazole-trimethoprim (BACTRIM DS) 800-160 MG tablet Take 1 tablet by mouth 2 (two) times daily. 11/07/20   [provider]  TRULICITY 1.93 XT/0.2IO SOPN Inject 0.75 mg into the skin once a week. 11/05/20   [provider]  valsartan (DIOVAN) 80 MG tablet Take 80 mg by mouth at  bedtime. 11/03/20   [provider]  diphenhydrAMINE (BENADRYL) 25 MG tablet Take 1 tablet (25 mg total) by mouth every 8 (eight) hours as needed for allergies. 07/22/16 12/23/19  Theodis Blaze, MD    Allergies    Amoxicillin-pot clavulanate, Morphine and related, and Sildenafil  Review of Systems   Review of Systems  Respiratory:  Negative for shortness of breath.   Cardiovascular:  Negative for chest pain.  Gastrointestinal:  Negative for abdominal pain.  Neurological:  Negative for headaches.  All other systems reviewed and are negative.  Physical Exam Updated Vital Signs BP (!) 176/93   Pulse 91   Temp 98.1 F (36.7 C) (Oral)   Resp 16   Ht 6\' 2"  (1.88 m)   Wt (!) 140 kg   SpO2 96%   BMI 39.63 kg/m   Physical Exam Vitals and nursing note reviewed.  Constitutional:      Appearance: He is well-developed.  HENT:     Head: Normocephalic and atraumatic.     Mouth/Throat:     Mouth: Mucous membranes are moist.     Pharynx: Oropharynx is clear.  Eyes:     Pupils: Pupils are equal, round, and reactive to light.  Cardiovascular:     Rate and Rhythm: Normal rate.  Pulmonary:     Effort: Pulmonary effort is normal. No respiratory distress.  Abdominal:     General: Abdomen is flat. There is no distension.  Musculoskeletal:        General: Normal range of motion.     Cervical back: Normal range of motion.  Skin:    General: Skin is warm and dry.     Findings: Erythema (right second toe and dorsum of foot as well.) present.  Neurological:     General: No focal deficit present.     Mental Status: He is alert.    ED Results / Procedures / Treatments   Labs (all labs ordered are listed, but only abnormal results are displayed) Labs Reviewed  CBC WITH DIFFERENTIAL/PLATELET - Abnormal; Notable for the following components:      Result Value   RBC 4.13 (*)    All other components within normal limits  BASIC METABOLIC PANEL - Abnormal; Notable for the following  components:   Sodium 133 (*)    Potassium 3.1 (*)    Chloride 94 (*)    Glucose, Bld 222 (*)    All other components within normal limits  CBG MONITORING, ED - Abnormal; Notable for the following components:   Glucose-Capillary 244 (*)    All other components within normal limits  CULTURE, BLOOD (ROUTINE X 2)  CULTURE, BLOOD (ROUTINE X 2)  LACTIC ACID, PLASMA    EKG None  Radiology No results found.  Procedures Procedures   Medications Ordered in ED Medications -  No data to display  ED Course  I have reviewed the triage vital signs and the nursing notes.  Pertinent labs & imaging results that were available during my care of the patient were reviewed by me and considered in my medical decision making (see chart for details).    MDM Rules/Calculators/A&P                          Toe and subsequent foot cellulitis. No sepsis. Abx started. Refer to podiatry.   Final Clinical Impression(s) / ED Diagnoses Final diagnoses:  Osteomyelitis of right foot, unspecified type ([place])  Cellulitis, unspecified cellulitis site  Abscess    Rx / DC Orders ED Discharge Orders     None        Bertie Mcconathy, Corene Cornea, MD 01/25/21 0120

## 2021-02-02 ENCOUNTER — Other Ambulatory Visit: Payer: Self-pay | Admitting: Sports Medicine

## 2021-02-02 ENCOUNTER — Telehealth: Payer: Self-pay | Admitting: Sports Medicine

## 2021-02-02 DIAGNOSIS — M86171 Other acute osteomyelitis, right ankle and foot: Secondary | ICD-10-CM

## 2021-02-02 NOTE — Progress Notes (Signed)
Changed MRI to [facility] because Out of network at GI

## 2021-02-02 NOTE — Telephone Encounter (Signed)
Pt insurance is not authorized at [employer] He does not have out of network benefits. We will need to send him to another imaging facility that's in his network. [facility] tried to send the auth through 2 times. Patient was notified.

## 2021-02-02 NOTE — Telephone Encounter (Signed)
Pt called stating the imaging center he was referred to doesn't take his insurance. Please advise.

## 2021-02-04 ENCOUNTER — Other Ambulatory Visit: Payer: Medicare HMO

## 2021-02-09 ENCOUNTER — Other Ambulatory Visit: Payer: Self-pay | Admitting: Sports Medicine

## 2021-02-09 ENCOUNTER — Telehealth: Payer: Self-pay | Admitting: *Deleted

## 2021-02-09 MED ORDER — CLINDAMYCIN HCL 300 MG PO CAPS: 300.0000 mg | ORAL_CAPSULE | Freq: Three times a day (TID) | ORAL | 0 refills | Status: DC

## 2021-02-09 NOTE — Progress Notes (Signed)
Refilled Clindamycin  I will have Ammie to call MRI at [facility] to follow up on the order I put in  Thanks Dr. Cannon Kettle

## 2021-02-09 NOTE — Telephone Encounter (Signed)
Patient has been scheduled at [facility].July 9th @1 : 00,arrival time 12:30,patient has been notified and given telephone # ,if he needs to [phone],opt.3.   MRI has been pre- Authorized -case ref. #[id number] w/ Lattie Haw B).  Approved-06/22 to 12/22- # [id number]

## 2021-02-10 NOTE — Telephone Encounter (Signed)
Approval letter will be faxed to office.

## 2021-02-16 ENCOUNTER — Ambulatory Visit (HOSPITAL_COMMUNITY)
Admission: RE | Admit: 2021-02-16 | Discharge: 2021-02-16 | Disposition: A | Discharge status: Home / Self Care | Payer: Medicare HMO | Source: Ambulatory Visit | Attending: Sports Medicine | Admitting: Sports Medicine

## 2021-02-16 ENCOUNTER — Telehealth: Payer: Self-pay | Admitting: Sports Medicine

## 2021-02-16 DIAGNOSIS — M86171 Other acute osteomyelitis, right ankle and foot: Secondary | ICD-10-CM | POA: Diagnosis not present

## 2021-02-16 NOTE — Telephone Encounter (Signed)
Patient is calling to inform Dr. Cannon Kettle that he got his MRI this morning. Will he need to get set up a follow up appointment to discuss his results? Please advise.

## 2021-02-17 NOTE — Progress Notes (Signed)
Please schedule a f/u appointment

## 2021-02-20 ENCOUNTER — Ambulatory Visit (HOSPITAL_COMMUNITY): Payer: Medicare HMO

## 2021-02-25 ENCOUNTER — Other Ambulatory Visit: Payer: Self-pay | Admitting: Sports Medicine

## 2021-04-01 ENCOUNTER — Other Ambulatory Visit: Payer: Self-pay

## 2021-04-01 ENCOUNTER — Encounter: Payer: Self-pay | Admitting: Sports Medicine

## 2021-04-01 ENCOUNTER — Ambulatory Visit: Payer: Medicare HMO | Admitting: Sports Medicine

## 2021-04-01 DIAGNOSIS — L03031 Cellulitis of right toe: Secondary | ICD-10-CM | POA: Diagnosis not present

## 2021-04-01 DIAGNOSIS — E119 Type 2 diabetes mellitus without complications: Secondary | ICD-10-CM

## 2021-04-01 DIAGNOSIS — L02611 Cutaneous abscess of right foot: Secondary | ICD-10-CM

## 2021-04-01 DIAGNOSIS — M86171 Other acute osteomyelitis, right ankle and foot: Secondary | ICD-10-CM

## 2021-04-01 DIAGNOSIS — L84 Corns and callosities: Secondary | ICD-10-CM | POA: Diagnosis not present

## 2021-04-01 MED ORDER — SULFAMETHOXAZOLE-TRIMETHOPRIM 800-160 MG PO TABS
1.0000 | ORAL_TABLET | Freq: Two times a day (BID) | ORAL | 0 refills | Status: DC
Start: 2021-04-01 — End: 2021-07-19

## 2021-04-01 NOTE — Progress Notes (Signed)
Subjective: Jonathon Lin is a 65 y.o. male patient seen in office for follow-up evaluation of ulceration of the right second toe.  Patient reports that toe has been scabbed over but has been swelling more, denies nausea, vomiting, fever, chills or any other constitutional symptoms at this time.  Fasting blood sugar not recorded.  Patient Active Problem List   Diagnosis Date Noted   COVID-19 10/06/2020   Allergic reaction 07/21/2016   Adenomatous colon polyp 08/10/2015   VIRAL URI 09/22/2009   DEPRESSION 05/26/2009   ALLERGIC RHINITIS 05/26/2009   Hypothyroidism 03/19/2009   OTHER SPECIFIED DISEASE OF NAIL 03/17/2009   PANIC ATTACK 02/10/2009   COUGH 02/10/2009   INTERTRIGO, CANDIDAL 11/11/2008   DIABETES MELLITUS II, UNCOMPLICATED [id number]   HYPERLIPIDEMIA 05/23/2006   OBESITY, NOS 05/23/2006   ERECTILE DYSFUNCTION 05/23/2006   HYPERTENSION, BENIGN SYSTEMIC 05/23/2006   SEBORRHEIC DERMATITIS, NOS 05/23/2006   Sleep apnea 05/23/2006   Current Outpatient Medications on File Prior to Visit  Medication Sig Dispense Refill   amLODipine (NORVASC) 5 MG tablet Take 1 tablet (5 mg total) by mouth daily. 30 tablet 0   aspirin EC 81 MG tablet Take 81 mg by mouth daily.     azithromycin (ZITHROMAX) 250 MG tablet Take 250 mg by mouth as directed.     Bacitracin-Polymyxin B (WAL-SPORIN) [phone] UNIT/GM OINT Apply topically 3 (three) times daily as needed.     celecoxib (CELEBREX) 200 MG capsule Take 200 mg by mouth daily.     chlorthalidone (HYGROTON) 25 MG tablet Take 25 mg by mouth every morning.     ciprofloxacin (CIPRO) 750 MG tablet Take 750 mg by mouth 2 (two) times daily.     clindamycin (CLEOCIN) 300 MG capsule TAKE 1 CAPSULE(300 MG) BY MOUTH THREE TIMES DAILY 30 capsule 0   glimepiride (AMARYL) 2 MG tablet Take 1 tablet by mouth daily.  0   ibuprofen (ADVIL,MOTRIN) 200 MG tablet Take 600 mg by mouth every 6 (six) hours as needed for moderate pain.     linezolid (ZYVOX) 600 MG  tablet Take 600 mg by mouth 2 (two) times daily.     metFORMIN (GLUCOPHAGE) 1000 MG tablet Take 1,000 mg by mouth 2 (two) times daily with a meal.     sertraline (ZOLOFT) 50 MG tablet Take 1 tablet by mouth at bedtime. OUT OF THIS FOR ONE WEEK  1   simvastatin (ZOCOR) 20 MG tablet Take 20 mg by mouth at bedtime.     TRULICITY [id number] SOPN Inject 0.75 mg into the skin once a week.     valsartan (DIOVAN) 80 MG tablet Take 80 mg by mouth at bedtime.     [DISCONTINUED] diphenhydrAMINE (BENADRYL) 25 MG tablet Take 1 tablet (25 mg total) by mouth every 8 (eight) hours as needed for allergies. 30 tablet 0   No current facility-administered medications on file prior to visit.   Allergies  Allergen Reactions   Amoxicillin-Pot Clavulanate Itching    REACTION: hives   Morphine And Related Other (See Comments)    hypertension   Sildenafil     REACTION: headaches    Recent Results (from the past 2160 hour(s))  Lactic acid, plasma     Status: None   Collection Time: 01/05/21  7:15 PM  Result Value Ref Range   Lactic Acid, Venous 1.4 0.5 - 1.9 mmol/L    Comment: Performed at [facility], [address], [place],  [address]  Culture, blood (routine x 2)  Status: None   Collection Time: 01/05/21  7:15 PM   Specimen: BLOOD  Result Value Ref Range   Specimen Description BLOOD RIGHT ARM    Special Requests      BOTTLES DRAWN AEROBIC AND ANAEROBIC Blood Culture results may not be optimal due to an excessive volume of blood received in culture bottles   Culture      NO GROWTH 5 DAYS Performed at [facility], [place] [address] [place], [place] [address]    Report Status 01/10/2021 FINAL   CBG monitoring, ED     Status: Abnormal   Collection Time: 01/05/21  7:16 PM  Result Value Ref Range   Glucose-Capillary 244 (H) 70 - 99 mg/dL    Comment: Glucose reference range applies only to samples taken after fasting for at least 8 hours.  Culture, blood (routine x 2)      Status: None   Collection Time: 01/05/21  7:37 PM   Specimen: BLOOD  Result Value Ref Range   Specimen Description BLOOD LEFT ARM    Special Requests      BOTTLES DRAWN AEROBIC ONLY Blood Culture adequate volume   Culture      NO GROWTH 5 DAYS Performed at [facility], [place] [address] [place], [place] [address]    Report Status 01/10/2021 FINAL   CBC with Differential     Status: Abnormal   Collection Time: 01/05/21  7:41 PM  Result Value Ref Range   WBC 6.4 4.0 - 10.5 K/uL   RBC 4.13 (L) 4.22 - 5.81 MIL/uL   Hemoglobin 13.8 13.0 - 17.0 g/dL   HCT 39.6 39.0 - 52.0 %   MCV 95.9 80.0 - 100.0 fL   MCH 33.4 26.0 - 34.0 pg   MCHC 34.8 30.0 - 36.0 g/dL   RDW 12.0 11.5 - 15.5 %   Platelets 236 150 - 400 K/uL   nRBC 0.0 0.0 - 0.2 %   Neutrophils Relative % 70 %   Neutro Abs 4.5 1.7 - 7.7 K/uL   Lymphocytes Relative 14 %   Lymphs Abs 0.9 0.7 - 4.0 K/uL   Monocytes Relative 12 %   Monocytes Absolute 0.7 0.1 - 1.0 K/uL   Eosinophils Relative 3 %   Eosinophils Absolute 0.2 0.0 - 0.5 K/uL   Basophils Relative 1 %   Basophils Absolute 0.1 0.0 - 0.1 K/uL   Immature Granulocytes 0 %   Abs Immature Granulocytes 0.01 0.00 - 0.07 K/uL    Comment: Performed at [facility], [place] [address], [place], [place] [id number]  Basic metabolic panel     Status: Abnormal   Collection Time: 01/05/21  7:41 PM  Result Value Ref Range   Sodium 133 (L) 135 - 145 mmol/L   Potassium 3.1 (L) 3.5 - 5.1 mmol/L   Chloride 94 (L) 98 - 111 mmol/L   CO2 27 22 - 32 mmol/L   Glucose, Bld 222 (H) 70 - 99 mg/dL    Comment: Glucose reference range applies only to samples taken after fasting for at least 8 hours.   BUN 17 8 - 23 mg/dL   Creatinine, Ser 1.04 0.61 - 1.24 mg/dL   Calcium 9.3 8.9 - 10.3 mg/dL   GFR, Estimated >60 >60 mL/min    Comment: (NOTE) Calculated using the CKD-EPI Creatinine Equation (2021)    Anion gap 12 5 - 15    Comment: Performed at [place],  [place] [address]    Objective: There were no vitals filed for this visit.  General: Patient is awake, alert, oriented x 3 and in no acute distress.  Dermatology: Skin is warm and dry bilateral with dry heme at right 2nd toe no underlying opening once debrided. There is no malodor, no active drainage, localized erythema, localized edema.   Vascular: Dorsalis Pedis pulse = 1/4 Bilateral,  Posterior Tibial pulse = 0/4 Bilateral,  Capillary Fill Time < 5 seconds  Neurologic: Protective sensation diminished on the right.  Musculosketal: There is no pain with palpation to right 2nd toe. No pain with compression to calves bilateral. No gross bony deformities noted bilateral.  No results for input(s): GRAMSTAIN, LABORGA in the last 8760 hours.  Assessment and Plan:  Problem List Items Addressed This Visit   None Visit Diagnoses     Pre-ulcerative calluses    -  Primary   Other acute osteomyelitis of right foot (HCC)       Relevant Medications   sulfamethoxazole-trimethoprim (BACTRIM DS) 800-160 MG tablet   Cellulitis and abscess of toe of right foot       Diabetes mellitus without complication (HCC)            -Examined patient  -Previous MRI reviewed -Applied protective dry sterile dressing and instructed patient to continue with daily dressings at home consisting of same -Rx Bactrim for preventative measures since patient has known OM with swelling of the toe and does not want a amputation at this time -Advised to avoid shoe that does not rub the toe - Advised patient to go to the ER or return to office if the wound worsens or if constitutional symptoms are present.   -Patient to return to office if fails to continue to improve or sooner if problems arise.  Landis Martins, DPM

## 2021-07-19 ENCOUNTER — Other Ambulatory Visit: Payer: Self-pay | Admitting: Sports Medicine

## 2021-07-19 ENCOUNTER — Telehealth: Payer: Self-pay | Admitting: *Deleted

## 2021-07-19 MED ORDER — SULFAMETHOXAZOLE-TRIMETHOPRIM 800-160 MG PO TABS: 1.0000 | ORAL_TABLET | Freq: Two times a day (BID) | ORAL | 0 refills | Status: DC

## 2021-07-19 NOTE — Telephone Encounter (Signed)
Returned the call to patient, no answer but left a message that a medication has been sent to pharmacy on file and that per physician's recommendations he is to scheduled an appointment asap.

## 2021-07-19 NOTE — Telephone Encounter (Signed)
Patient is calling for an antibiotic for his toe, may be infected but has not been seen in office since 08/22. Please schedule for an upcoming appointment to evaluate.

## 2021-07-19 NOTE — Progress Notes (Signed)
Refilled bactrim meanwhile he should get an appt to be seen

## 2021-07-19 NOTE — Telephone Encounter (Signed)
Called patient, no answer, left vmessage to call back to schedule an appointment asap

## 2021-09-02 ENCOUNTER — Other Ambulatory Visit: Payer: Self-pay

## 2021-09-02 ENCOUNTER — Ambulatory Visit: Payer: HMO | Admitting: Sports Medicine

## 2021-09-02 ENCOUNTER — Ambulatory Visit (INDEPENDENT_AMBULATORY_CARE_PROVIDER_SITE_OTHER): Payer: HMO

## 2021-09-02 DIAGNOSIS — L02611 Cutaneous abscess of right foot: Secondary | ICD-10-CM

## 2021-09-02 DIAGNOSIS — L84 Corns and callosities: Secondary | ICD-10-CM

## 2021-09-02 DIAGNOSIS — E119 Type 2 diabetes mellitus without complications: Secondary | ICD-10-CM

## 2021-09-02 DIAGNOSIS — L03031 Cellulitis of right toe: Secondary | ICD-10-CM

## 2021-09-02 DIAGNOSIS — R600 Localized edema: Secondary | ICD-10-CM | POA: Diagnosis not present

## 2021-09-02 MED ORDER — CLINDAMYCIN HCL 300 MG PO CAPS: 300.0000 mg | ORAL_CAPSULE | Freq: Three times a day (TID) | ORAL | 0 refills | Status: DC

## 2021-09-02 NOTE — Progress Notes (Signed)
Subjective: Jonathon Lin is a 66 y.o. male patient seen in office for evaluation of dark lesion to right 3rd toe that started 1 month ago after he tried to trim his toenail. Denies any redness, warmth, swelling, drainage. No constitutional symptoms.  Patient Active Problem List   Diagnosis Date Noted   COVID-19 10/06/2020   Allergic reaction 07/21/2016   Adenomatous colon polyp 08/10/2015   VIRAL URI 09/22/2009   DEPRESSION 05/26/2009   ALLERGIC RHINITIS 05/26/2009   Hypothyroidism 03/19/2009   OTHER SPECIFIED DISEASE OF NAIL 03/17/2009   PANIC ATTACK 02/10/2009   COUGH 02/10/2009   INTERTRIGO, CANDIDAL 11/11/2008   DIABETES MELLITUS II, UNCOMPLICATED 83/15/1761   HYPERLIPIDEMIA 05/23/2006   OBESITY, NOS 05/23/2006   ERECTILE DYSFUNCTION 05/23/2006   HYPERTENSION, BENIGN SYSTEMIC 05/23/2006   SEBORRHEIC DERMATITIS, NOS 05/23/2006   Sleep apnea 05/23/2006   Current Outpatient Medications on File Prior to Visit  Medication Sig Dispense Refill   amLODipine (NORVASC) 5 MG tablet Take 1 tablet (5 mg total) by mouth daily. 30 tablet 0   aspirin EC 81 MG tablet Take 81 mg by mouth daily.     azithromycin (ZITHROMAX) 250 MG tablet Take 250 mg by mouth as directed.     Bacitracin-Polymyxin B (WAL-SPORIN) [id number] UNIT/GM OINT Apply topically 3 (three) times daily as needed.     celecoxib (CELEBREX) 200 MG capsule Take 200 mg by mouth daily.     chlorthalidone (HYGROTON) 25 MG tablet Take 25 mg by mouth every morning.     ciprofloxacin (CIPRO) 750 MG tablet Take 750 mg by mouth 2 (two) times daily.     glimepiride (AMARYL) 2 MG tablet Take 1 tablet by mouth daily.  0   ibuprofen (ADVIL,MOTRIN) 200 MG tablet Take 600 mg by mouth every 6 (six) hours as needed for moderate pain.     linezolid (ZYVOX) 600 MG tablet Take 600 mg by mouth 2 (two) times daily.     metFORMIN (GLUCOPHAGE) 1000 MG tablet Take 1,000 mg by mouth 2 (two) times daily with a meal.     pioglitazone (ACTOS) 15 MG tablet  Take 15 mg by mouth daily.     sertraline (ZOLOFT) 50 MG tablet Take 1 tablet by mouth at bedtime. OUT OF THIS FOR ONE WEEK  1   simvastatin (ZOCOR) 20 MG tablet Take 20 mg by mouth at bedtime.     sulfamethoxazole-trimethoprim (BACTRIM DS) 800-160 MG tablet Take 1 tablet by mouth 2 (two) times daily. 28 tablet 0   TRULICITY 6.94 WN/4.6EV SOPN Inject 0.75 mg into the skin once a week.     valsartan (DIOVAN) 80 MG tablet Take 80 mg by mouth at bedtime.     [DISCONTINUED] diphenhydrAMINE (BENADRYL) 25 MG tablet Take 1 tablet (25 mg total) by mouth every 8 (eight) hours as needed for allergies. 30 tablet 0   No current facility-administered medications on file prior to visit.   Allergies  Allergen Reactions   Amoxicillin-Pot Clavulanate Itching    REACTION: hives   Morphine And Related Other (See Comments)    hypertension   Sildenafil     REACTION: headaches    No results found for this or any previous visit (from the past 2160 hour(s)).  Objective: There were no vitals filed for this visit.  General: Patient is awake, alert, oriented x 3 and in no acute distress.  Dermatology: Skin is warm and dry bilateral with a hemorraghic callus at right 3rd toe,There is no malodor, no active drainage,  no erythema, minimal edema. No acute signs of infection.   Vascular: Dorsalis Pedis pulse = 2/4 Bilateral,  Posterior Tibial pulse = 1/4 Bilateral,  Capillary Fill Time < 5 seconds  Neurologic: Protective sensation severely diminished bilateral.   Musculosketal: + hammertoe, no pain to right 3rd toe.   Xrays, Right foot:Small fleck at 3rd toe in soft tissues but no other bony destruction suggestive of osteomyelitis. No gas in soft tissues.   No results for input(s): GRAMSTAIN, LABORGA in the last 8760 hours.  Assessment and Plan:  Problem List Items Addressed This Visit   None Visit Diagnoses     Edema of right foot    -  Primary   Relevant Orders   DG Foot Complete Right    Pre-ulcerative calluses       Cellulitis and abscess of toe of right foot       Diabetes mellitus without complication (HCC)       Relevant Medications   pioglitazone (ACTOS) 15 MG tablet        -Examined patient and discussed the progression of the pre-ulcerative callus and treatment alternatives. -Debrided keratotic skin at hemorraghic callus using a tissue nipper. No underlying opening.  -Rx clindamycin for preventative measures since patient had a little edema to the toe and past history of ostemyelitis on 2nd toe before on the right. -Patient to return to office in 3 weeks for follow up care if not resolved  or sooner if problems arise.  Landis Martins, DPM

## 2021-09-23 ENCOUNTER — Ambulatory Visit: Payer: HMO | Admitting: Sports Medicine

## 2021-10-08 DIAGNOSIS — Z125 Encounter for screening for malignant neoplasm of prostate: Secondary | ICD-10-CM | POA: Diagnosis not present

## 2021-10-08 DIAGNOSIS — F419 Anxiety disorder, unspecified: Secondary | ICD-10-CM | POA: Diagnosis not present

## 2021-10-08 DIAGNOSIS — E1129 Type 2 diabetes mellitus with other diabetic kidney complication: Secondary | ICD-10-CM | POA: Diagnosis not present

## 2021-10-08 DIAGNOSIS — I10 Essential (primary) hypertension: Secondary | ICD-10-CM | POA: Diagnosis not present

## 2021-10-08 DIAGNOSIS — Z79899 Other long term (current) drug therapy: Secondary | ICD-10-CM | POA: Diagnosis not present

## 2021-10-08 DIAGNOSIS — E291 Testicular hypofunction: Secondary | ICD-10-CM | POA: Diagnosis not present

## 2021-10-19 DIAGNOSIS — E785 Hyperlipidemia, unspecified: Secondary | ICD-10-CM | POA: Diagnosis not present

## 2021-10-19 DIAGNOSIS — E1122 Type 2 diabetes mellitus with diabetic chronic kidney disease: Secondary | ICD-10-CM | POA: Diagnosis not present

## 2021-10-19 DIAGNOSIS — I10 Essential (primary) hypertension: Secondary | ICD-10-CM | POA: Diagnosis not present

## 2021-11-18 IMAGING — MR MR TOES*R* W/O CM
5 series · 40 of 40 positions shown · non-contrast
Comparison: Right foot radiograph 01/05/2021

CLINICAL DATA: Foot swelling, diabetic, osteomyelitis suspected,
xray done. Swelling right second toe for 2 months

EXAM:
MRI OF THE RIGHT TOES WITHOUT CONTRAST
TECHNIQUE: Multiplanar, multisequence MR imaging of the right toes was
performed. No intravenous contrast was administered.

[Series 3: T1 · coronal · right · 3.0mm · 0.47mm/px · 10 of 36 slices shown (1 of 2)]
[im 1/36]
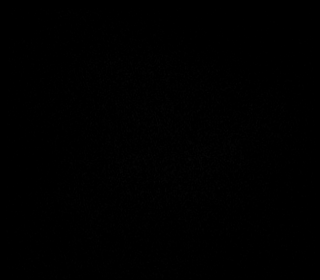
[im 4/36]
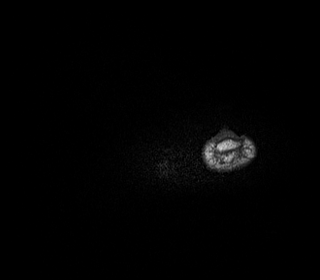
[im 8/36]
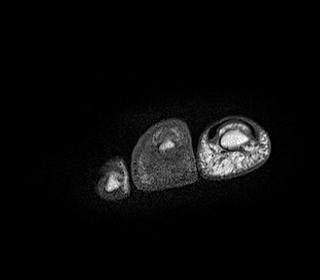
[im 12/36]
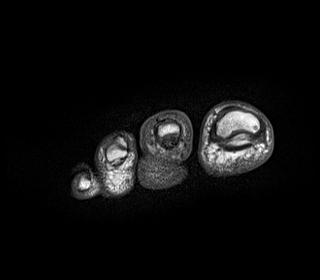
[im 16/36]
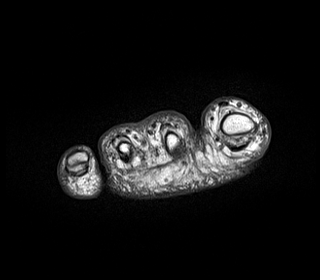
[im 20/36]
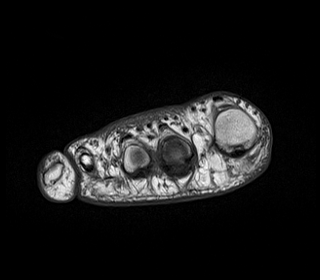
[im 24/36]
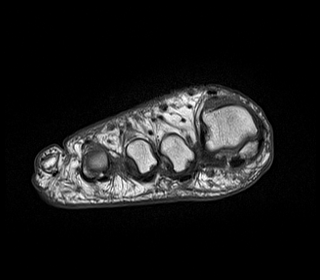
[im 28/36]
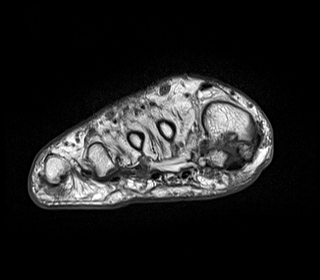
[im 32/36]
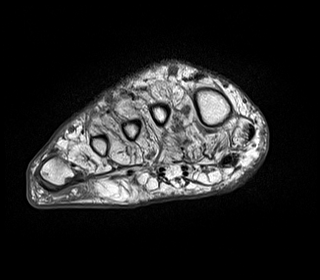
[im 36/36]
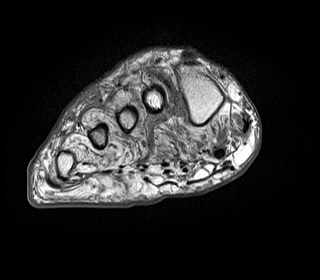

[Series 4: T2 fat-sat · coronal · right · 3.0mm · 0.46mm/px · 9 of 36 slices shown (1 of 2)]
[im 1/36]
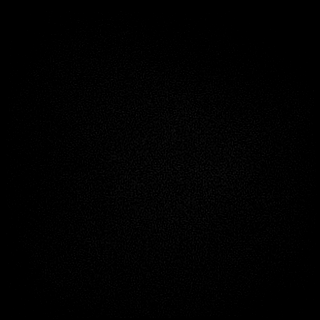
[im 5/36]
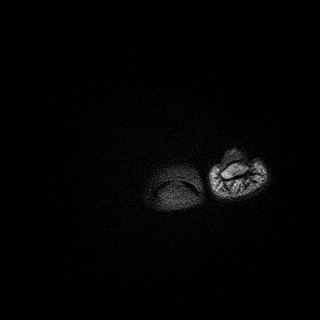
[im 9/36]
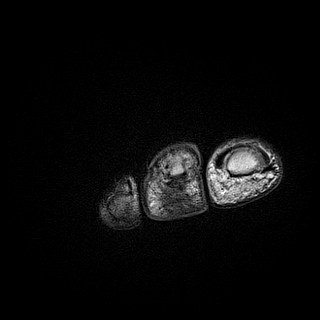
[im 14/36]
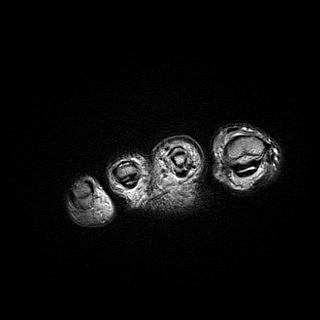
[im 18/36]
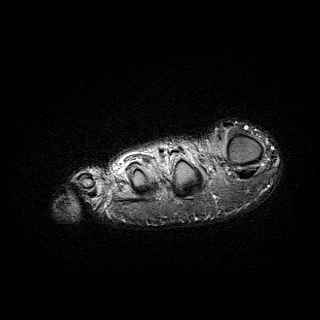
[im 22/36]
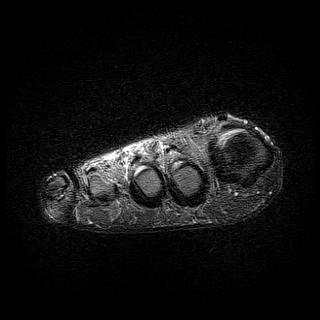
[im 27/36]
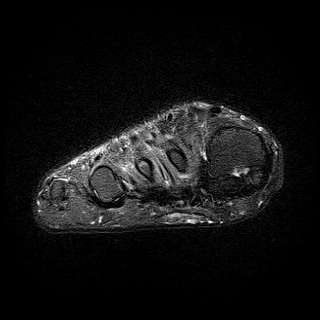
[im 31/36]
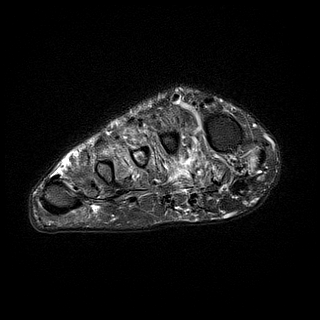
[im 36/36]
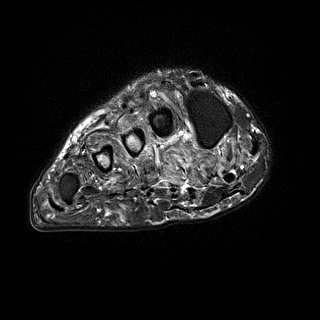

[Series 5: T1 · axial · right · 3.0mm · 0.70mm/px · z∈[-34,+56]mm · 6 of 24 slices shown (2 of 2)]
[im 1/24]
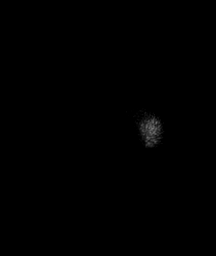
[im 5/24]
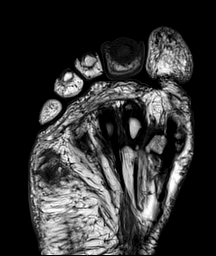
[im 10/24]
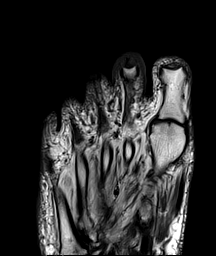
[im 14/24]
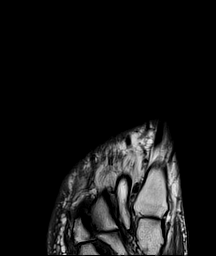
[im 19/24]
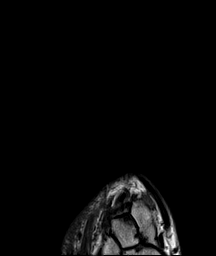
[im 24/24]
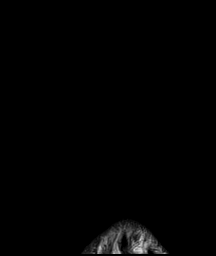

[Series 6: T2 fat-sat · axial · right · 3.0mm · 0.62mm/px · z∈[-35,+56]mm · 6 of 24 slices shown (2 of 2)]
[im 1/24]
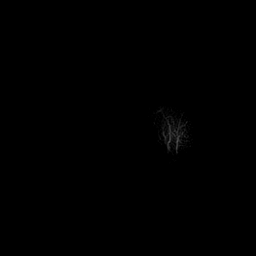
[im 5/24]
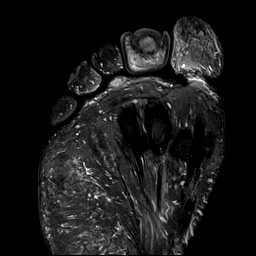
[im 10/24]
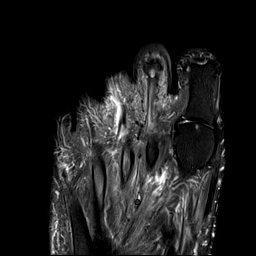
[im 14/24]
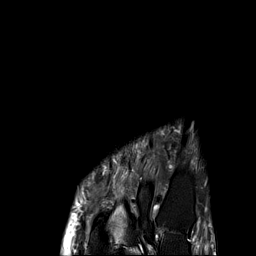
[im 19/24]
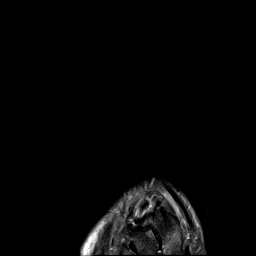
[im 24/24]
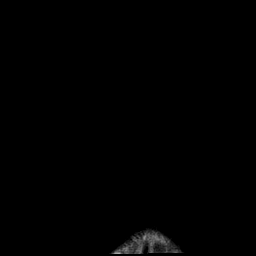

[Series 7: STIR · sagittal · right · 3.0mm · 0.70mm/px · 9 of 36 slices shown]
[im 1/36]
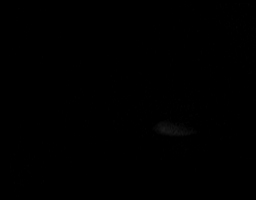
[im 5/36]
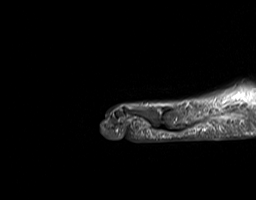
[im 9/36]
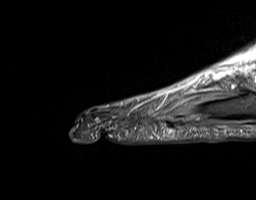
[im 14/36]
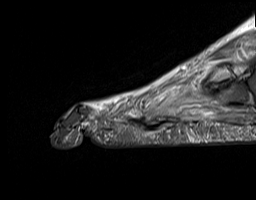
[im 18/36]
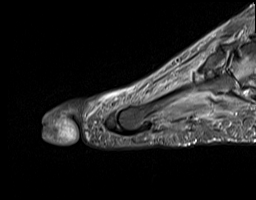
[im 22/36]
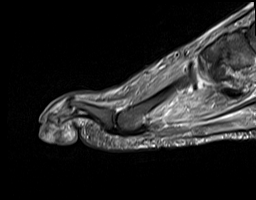
[im 27/36]
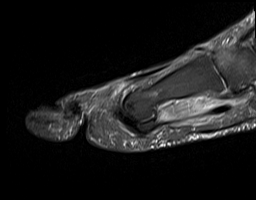
[im 31/36]
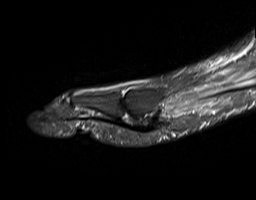
[im 36/36]
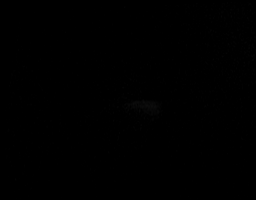

[40 of 40 positions shown; findings below may reference images not displayed]

FINDINGS: Bones/Joint/Cartilage

There is increased STIR signal with associated confluent low T1
signal within the distal phalanx of the second toe. There is
adjacent soft tissue swelling and ulceration at the tip of the
second toe.

There is edema signal throughout the third and fourth metatarsals
without visible fracture line. There is partially visualized edema
signal within the cuneiforms.

Ligaments

There is no evidence of plantar plate tear.

Muscles and Tendons

Diffuse muscle atrophy. Diffuse edema throughout the intrinsic
musculature.

Soft tissues

Mild subcutaneous soft tissue swelling.
IMPRESSION: Findings consistent with osteomyelitis of the second toe distal
phalanx. Adjacent soft tissue swelling and ulceration.

Edema signal throughout the third and fourth metatarsals consistent
with stress reaction. Additional partially visualized edema signal
within the cuneiforms and periarticular at the tarsometatarsal
joints which may be stress reaction or related to early changes of
neuropathic arthropathy.

## 2021-12-02 ENCOUNTER — Ambulatory Visit: Payer: HMO | Admitting: Sports Medicine

## 2021-12-02 DIAGNOSIS — M79674 Pain in right toe(s): Secondary | ICD-10-CM

## 2021-12-02 DIAGNOSIS — M2041 Other hammer toe(s) (acquired), right foot: Secondary | ICD-10-CM

## 2021-12-02 DIAGNOSIS — M79675 Pain in left toe(s): Secondary | ICD-10-CM

## 2021-12-02 DIAGNOSIS — B351 Tinea unguium: Secondary | ICD-10-CM | POA: Diagnosis not present

## 2021-12-02 DIAGNOSIS — M2042 Other hammer toe(s) (acquired), left foot: Secondary | ICD-10-CM

## 2021-12-02 DIAGNOSIS — E119 Type 2 diabetes mellitus without complications: Secondary | ICD-10-CM

## 2021-12-02 DIAGNOSIS — I739 Peripheral vascular disease, unspecified: Secondary | ICD-10-CM

## 2021-12-02 NOTE — Progress Notes (Signed)
Subjective: ?Jonathon Lin is a 66 y.o. male patient with history of diabetes who presents to office today complaining of long,mildly painful nails  while ambulating in shoes; unable to trim. States that he knocked part of the left great toenail off and ended up trimming the loose portion off with a wire cutter.  Fasting blood sugar not recorded this morning. ? ?PCP visit last year ? ?Patient Active Problem List  ? Diagnosis Date Noted  ? COVID-19 10/06/2020  ? Allergic reaction 07/21/2016  ? Adenomatous colon polyp 08/10/2015  ? VIRAL URI 09/22/2009  ? DEPRESSION 05/26/2009  ? ALLERGIC RHINITIS 05/26/2009  ? Hypothyroidism 03/19/2009  ? OTHER SPECIFIED DISEASE OF NAIL 03/17/2009  ? PANIC ATTACK 02/10/2009  ? COUGH 02/10/2009  ? INTERTRIGO, CANDIDAL 11/11/2008  ? DIABETES MELLITUS II, UNCOMPLICATED 54/00/8676  ? HYPERLIPIDEMIA 05/23/2006  ? OBESITY, NOS 05/23/2006  ? ERECTILE DYSFUNCTION 05/23/2006  ? HYPERTENSION, BENIGN SYSTEMIC 05/23/2006  ? SEBORRHEIC DERMATITIS, NOS 05/23/2006  ? Sleep apnea 05/23/2006  ? ?Current Outpatient Medications on File Prior to Visit  ?Medication Sig Dispense Refill  ? amLODipine (NORVASC) 5 MG tablet Take 1 tablet (5 mg total) by mouth daily. 30 tablet 0  ? aspirin EC 81 MG tablet Take 81 mg by mouth daily.    ? azithromycin (ZITHROMAX) 250 MG tablet Take 250 mg by mouth as directed.    ? Bacitracin-Polymyxin B (WAL-SPORIN) [id number] UNIT/GM OINT Apply topically 3 (three) times daily as needed.    ? celecoxib (CELEBREX) 200 MG capsule Take 200 mg by mouth daily.    ? chlorthalidone (HYGROTON) 25 MG tablet Take 25 mg by mouth every morning.    ? ciprofloxacin (CIPRO) 750 MG tablet Take 750 mg by mouth 2 (two) times daily.    ? clindamycin (CLEOCIN) 300 MG capsule Take 1 capsule (300 mg total) by mouth 3 (three) times daily. 30 capsule 0  ? glimepiride (AMARYL) 2 MG tablet Take 1 tablet by mouth daily.  0  ? ibuprofen (ADVIL,MOTRIN) 200 MG tablet Take 600 mg by mouth every 6 (six)  hours as needed for moderate pain.    ? linezolid (ZYVOX) 600 MG tablet Take 600 mg by mouth 2 (two) times daily.    ? metFORMIN (GLUCOPHAGE) 1000 MG tablet Take 1,000 mg by mouth 2 (two) times daily with a meal.    ? pioglitazone (ACTOS) 15 MG tablet Take 15 mg by mouth daily.    ? sertraline (ZOLOFT) 50 MG tablet Take 1 tablet by mouth at bedtime. OUT OF THIS FOR ONE WEEK  1  ? simvastatin (ZOCOR) 20 MG tablet Take 20 mg by mouth at bedtime.    ? sulfamethoxazole-trimethoprim (BACTRIM DS) 800-160 MG tablet Take 1 tablet by mouth 2 (two) times daily. 28 tablet 0  ? TRULICITY 1.24 PY/0.9XI SOPN Inject 0.75 mg into the skin once a week.    ? valsartan (DIOVAN) 80 MG tablet Take 80 mg by mouth at bedtime.    ? [DISCONTINUED] diphenhydrAMINE (BENADRYL) 25 MG tablet Take 1 tablet (25 mg total) by mouth every 8 (eight) hours as needed for allergies. 30 tablet 0  ? ?No current facility-administered medications on file prior to visit.  ? ?Allergies  ?Allergen Reactions  ? Amoxicillin-Pot Clavulanate Itching  ?  REACTION: hives  ? Morphine And Related Other (See Comments)  ?  hypertension  ? Sildenafil   ?  REACTION: headaches  ? ? ?No results found for this or any previous visit (from the past 2160  hour(s)). ? ?Objective: ?General: Patient is awake, alert, and oriented x 3 and in no acute distress. ? ?Integument: Skin is warm, dry and supple bilateral. Nails are tender, long, thickened and dystrophic with subungual debris, consistent with onychomycosis, 1-5 bilateral. No signs of infection. No open lesions or preulcerative lesions present bilateral. Remaining integument unremarkable. ? ?Vasculature:  Dorsalis Pedis pulse 1/4 bilateral. Posterior Tibial pulse  0/4 bilateral. Capillary fill time <3 sec 1-5 bilateral.Scant  hair growth to the level of the digits.Temperature gradient within normal limits. No varicosities present bilateral.  Trace edema noted to ankles bilateral right greater than left. ? ?Neurology: Gross  sensation present via light touch. ? ?Musculoskeletal asymptomatic hammertoe deformities noted bilateral.  ? ?Assessment and Plan: ?Problem List Items Addressed This Visit   ?None ?Visit Diagnoses   ? ? Pain due to onychomycosis of toenails of both feet    -  Primary  ? PVD (peripheral vascular disease) ([place])      ? Diabetes mellitus without complication ([place])      ? Hammer toes of both feet      ? ?  ? ? ? ?-Examined patient. ?-Discussed and educated patient on diabetic foot care, especially with  ?regards to the vascular, neurological and musculoskeletal systems.  ?-Stressed the importance of good glycemic control and the detriment of not  ?controlling glucose levels in relation to the foot. ?-Mechanically debrided all painful nails 1-5 bilateral using sterile nail nipper and filed with dremel without incident  ?-Answered all patient questions ?-Patient to return  in 3 months for at risk foot care with Dr. Elisha Ponder ?-Patient advised to call the office if any problems or questions arise in the meantime. ? ?Landis Martins, DPM ?

## 2022-01-26 ENCOUNTER — Ambulatory Visit
Admission: RE | Admit: 2022-01-26 | Discharge: 2022-01-26 | Disposition: A | Discharge status: Home / Self Care | Payer: HMO | Source: Ambulatory Visit | Attending: Nurse Practitioner | Admitting: Nurse Practitioner

## 2022-01-26 VITALS — BP 156/82 | HR 99 | Temp 98.0°F | Resp 20

## 2022-01-26 DIAGNOSIS — E1165 Type 2 diabetes mellitus with hyperglycemia: Secondary | ICD-10-CM

## 2022-01-26 DIAGNOSIS — Z8639 Personal history of other endocrine, nutritional and metabolic disease: Secondary | ICD-10-CM | POA: Diagnosis not present

## 2022-01-26 DIAGNOSIS — R350 Frequency of micturition: Secondary | ICD-10-CM | POA: Diagnosis not present

## 2022-01-26 LAB — POCT URINALYSIS DIP (MANUAL ENTRY)
Bilirubin, UA: NEGATIVE
Blood, UA: NEGATIVE
Glucose, UA: 250 mg/dL — AB
Ketones, POC UA: NEGATIVE mg/dL
Leukocytes, UA: NEGATIVE
Nitrite, UA: NEGATIVE
Protein Ur, POC: NEGATIVE mg/dL
Spec Grav, UA: 1.025 (ref 1.010–1.025)
Urobilinogen, UA: 0.2 E.U./dL
pH, UA: 6 (ref 5.0–8.0)

## 2022-01-26 LAB — POCT FASTING CBG KUC MANUAL ENTRY: POCT Glucose (KUC): 219 mg/dL — AB (ref 70–99)

## 2022-01-26 NOTE — Discharge Instructions (Signed)
-   The urinalysis today does not show any signs of infection; it does show an elevated glucose level which is consistent with diabetes -We will send for urine culture and will call you if this comes back positive for infection -In the meantime, please limit your intake of fluids a few hours before bedtime; also try to increase your water intake -Follow-up with your primary care provider to discuss the naltrexone

## 2022-01-26 NOTE — ED Provider Notes (Signed)
[facility]    CSN: [id number] Arrival date & time: 01/26/22  1041      History   Chief Complaint Chief Complaint  Patient presents with   Urinary Frequency    Entered by patient   Appointment    1100    HPI Jonathon Lin is a 66 y.o. male.   Patient presents with urinary frequency, urgency, and hesitancy in the morning for the past few days.  He denies fevers, nausea/vomiting, new back pain or abdominal pain, suprapubic pressure.  Reports this only happens in the morning.  He denies any change in the odor of his urine, denies any hematuria, penile discharge.  Does report he recently lost his wife and had a friend passed away in his home last week; he has been drinking more alcohol as a result.  Reports he does not drink very much water, mostly drinks coffee, tea, beer.  Patient reports a history of diabetes; his fasting blood sugars usually between 130 and 170 per his report.  He is taking his medications as prescribed.  Denies history of enlarged prostate.  He asked me about naltrexone today to help with decreasing the beer intake.  Reports he has a physical coming up with his primary care provider in the next couple of weeks.    Past Medical History:  Diagnosis Date   Diabetes mellitus without complication ([place])    High cholesterol    Hypertension    Sleep apnea     Patient Active Problem List   Diagnosis Date Noted   COVID-19 10/06/2020   Allergic reaction 07/21/2016   Adenomatous colon polyp 08/10/2015   VIRAL URI 09/22/2009   DEPRESSION 05/26/2009   ALLERGIC RHINITIS 05/26/2009   Hypothyroidism 03/19/2009   OTHER SPECIFIED DISEASE OF NAIL 03/17/2009   PANIC ATTACK 02/10/2009   COUGH 02/10/2009   INTERTRIGO, CANDIDAL 11/11/2008   DIABETES MELLITUS II, UNCOMPLICATED 01/75/1025   HYPERLIPIDEMIA 05/23/2006   OBESITY, NOS 05/23/2006   ERECTILE DYSFUNCTION 05/23/2006   HYPERTENSION, BENIGN SYSTEMIC 05/23/2006   SEBORRHEIC DERMATITIS, NOS 05/23/2006    Sleep apnea 05/23/2006    Past Surgical History:  Procedure Laterality Date   HAND SURGERY         Home Medications    Prior to Admission medications   Medication Sig Start Date End Date Taking? Authorizing Provider  amLODipine (NORVASC) 5 MG tablet Take 1 tablet (5 mg total) by mouth daily. 07/22/16   Robbie Lis, MD  aspirin EC 81 MG tablet Take 81 mg by mouth daily.    [provider]  azithromycin (ZITHROMAX) 250 MG tablet Take 250 mg by mouth as directed. 10/13/20   [provider]  Bacitracin-Polymyxin B (WAL-SPORIN) [phone] UNIT/GM OINT Apply topically 3 (three) times daily as needed. 11/07/20   [provider]  celecoxib (CELEBREX) 200 MG capsule Take 200 mg by mouth daily. 12/22/20   [provider]  chlorthalidone (HYGROTON) 25 MG tablet Take 25 mg by mouth every morning. 11/05/20   [provider]  ciprofloxacin (CIPRO) 750 MG tablet Take 750 mg by mouth 2 (two) times daily. 01/04/21   [provider]  clindamycin (CLEOCIN) 300 MG capsule Take 1 capsule (300 mg total) by mouth 3 (three) times daily. 09/02/21   Stover, Titorya, DPM  glimepiride (AMARYL) 2 MG tablet Take 1 tablet by mouth daily. 06/16/16   [provider]  ibuprofen (ADVIL,MOTRIN) 200 MG tablet Take 600 mg by mouth every 6 (six) hours as needed for  moderate pain.    [provider]  linezolid (ZYVOX) 600 MG tablet Take 600 mg by mouth 2 (two) times daily. 01/04/21   [provider]  metFORMIN (GLUCOPHAGE) 1000 MG tablet Take 1,000 mg by mouth 2 (two) times daily with a meal.    [provider]  pioglitazone (ACTOS) 15 MG tablet Take 15 mg by mouth daily. 07/02/21   [provider]  sertraline (ZOLOFT) 50 MG tablet Take 1 tablet by mouth at bedtime. OUT OF THIS FOR ONE WEEK 07/12/16   [provider]  simvastatin (ZOCOR) 20 MG tablet Take 20 mg by mouth at bedtime. 12/14/20   [provider]   sulfamethoxazole-trimethoprim (BACTRIM DS) 800-160 MG tablet Take 1 tablet by mouth 2 (two) times daily. 07/19/21   Stover, Titorya, DPM  TRULICITY 1.61 WR/6.0AV SOPN Inject 0.75 mg into the skin once a week. 11/05/20   [provider]  valsartan (DIOVAN) 80 MG tablet Take 80 mg by mouth at bedtime. 11/03/20   [provider]  diphenhydrAMINE (BENADRYL) 25 MG tablet Take 1 tablet (25 mg total) by mouth every 8 (eight) hours as needed for allergies. 07/22/16 12/23/19  Theodis Blaze, MD    Family History History reviewed. No pertinent family history.  Social History Social History   Tobacco Use   Smoking status: Former   Smokeless tobacco: Never   Tobacco comments:    QUIT 8 YEARS AGO  Substance Use Topics   Alcohol use: Yes    Alcohol/week: 98.0 standard drinks of alcohol    Types: 98 Cans of beer per week    Comment: 14  beers daily   Drug use: No     Allergies   Amoxicillin-pot clavulanate, Morphine and related, and Sildenafil   Review of Systems Review of Systems Per HPI  Physical Exam Triage Vital Signs ED Triage Vitals  Enc Vitals Group     BP 01/26/22 1129 (!) 156/82     Pulse Rate 01/26/22 1129 99     Resp 01/26/22 1129 20     Temp 01/26/22 1129 98 F (36.7 C)     Temp Source 01/26/22 1129 Oral     SpO2 01/26/22 1129 97 %     Weight --      Height --      Head Circumference --      Peak Flow --      Pain Score 01/26/22 1124 0     Pain Loc --      Pain Edu? --      Excl. in [place]? --    No data found.  Updated Vital Signs BP (!) 156/82 (BP Location: Right Arm)   Pulse 99   Temp 98 F (36.7 C) (Oral)   Resp 20   SpO2 97%   Visual Acuity Right Eye Distance:   Left Eye Distance:   Bilateral Distance:    Right Eye Near:   Left Eye Near:    Bilateral Near:     Physical Exam Vitals and nursing note reviewed.  Constitutional:      General: He is not in acute distress.    Appearance: Normal appearance. He is obese. He is not  toxic-appearing.  HENT:     Head: Normocephalic and atraumatic.  Pulmonary:     Effort: Pulmonary effort is normal. No respiratory distress.  Abdominal:     General: Abdomen is flat. Bowel sounds are normal. There is no distension.     Palpations: Abdomen is  soft.     Tenderness: There is no right CVA tenderness or left CVA tenderness.  Skin:    General: Skin is warm and dry.     Capillary Refill: Capillary refill takes less than 2 seconds.     Coloration: Skin is not jaundiced or pale.     Findings: No erythema.  Neurological:     Mental Status: He is alert and oriented to person, place, and time.  Psychiatric:        Behavior: Behavior is cooperative.      UC Treatments / Results  Labs (all labs ordered are listed, but only abnormal results are displayed) Labs Reviewed  POCT URINALYSIS DIP (MANUAL ENTRY) - Abnormal; Notable for the following components:      Result Value   Clarity, UA hazy (*)    Glucose, UA =250 (*)    All other components within normal limits  POCT FASTING CBG [facility] MANUAL ENTRY - Abnormal; Notable for the following components:   POCT Glucose ([facility]) 219 (*)    All other components within normal limits  URINE CULTURE    EKG   Radiology No results found.  Procedures Procedures (including critical care time)  Medications Ordered in UC Medications - No data to display  Initial Impression / Assessment and Plan / UC Course  I have reviewed the triage vital signs and the nursing notes.  Pertinent labs & imaging results that were available during my care of the patient were reviewed by me and considered in my medical decision making (see chart for details).    Urinalysis today does not show any signs of acute infection; will send for urine culture to be sure given urgency, hesitancy and frequency.  Urinalysis does show elevated glucose, this may be a normal finding given history of diabetes and current medications.  However, his random blood sugar  today is 219, suspect this may be attributing to urinary hesitancy/frequency.  Encouraged decreasing fluid intake a few hours before bedtime to help with hesitancy and increasing water intake during the day.  Encourage slowly cutting back on beer intake.  Also encouraged close follow-up with primary care as scheduled for an examination with blood work.  The patient was given the opportunity to ask questions.  All questions answered to their satisfaction.  The patient is in agreement to this plan.    Final Clinical Impressions(s) / UC Diagnoses   Final diagnoses:  Urinary frequency  History of diabetes mellitus     Discharge Instructions      - The urinalysis today does not show any signs of infection; it does show an elevated glucose level which is consistent with diabetes -We will send for urine culture and will call you if this comes back positive for infection -In the meantime, please limit your intake of fluids a few hours before bedtime; also try to increase your water intake -Follow-up with your primary care provider to discuss the naltrexone    ED Prescriptions   None    PDMP not reviewed this encounter.   Eulogio Bear, NP 01/26/22 [phone]

## 2022-01-26 NOTE — ED Triage Notes (Signed)
Pt reports increased urinary frequency x 4-5 days.   Pt reports he increased the alcohol intake, drinks around 14 beers ago x 7-8 months. States his wife died 1 year ago, one of his friends died in his house on 03-Feb-2022 and he do not have family. Pt was reading in Internet Naltrexone may help him. Pot denies any suicidal thoughts. Pt states he feels sad.

## 2022-01-27 LAB — URINE CULTURE: Culture: NO GROWTH

## 2022-02-16 DIAGNOSIS — E785 Hyperlipidemia, unspecified: Secondary | ICD-10-CM | POA: Diagnosis not present

## 2022-02-16 DIAGNOSIS — I10 Essential (primary) hypertension: Secondary | ICD-10-CM | POA: Diagnosis not present

## 2022-02-16 DIAGNOSIS — Z79899 Other long term (current) drug therapy: Secondary | ICD-10-CM | POA: Diagnosis not present

## 2022-02-16 DIAGNOSIS — E1129 Type 2 diabetes mellitus with other diabetic kidney complication: Secondary | ICD-10-CM | POA: Diagnosis not present

## 2022-02-21 DIAGNOSIS — R7309 Other abnormal glucose: Secondary | ICD-10-CM | POA: Diagnosis not present

## 2022-02-21 DIAGNOSIS — E1122 Type 2 diabetes mellitus with diabetic chronic kidney disease: Secondary | ICD-10-CM | POA: Diagnosis not present

## 2022-02-21 DIAGNOSIS — E785 Hyperlipidemia, unspecified: Secondary | ICD-10-CM | POA: Diagnosis not present

## 2022-03-08 ENCOUNTER — Ambulatory Visit (INDEPENDENT_AMBULATORY_CARE_PROVIDER_SITE_OTHER): Payer: PPO | Admitting: Podiatry

## 2022-03-08 ENCOUNTER — Encounter: Payer: Self-pay | Admitting: Podiatry

## 2022-03-08 DIAGNOSIS — L03115 Cellulitis of right lower limb: Secondary | ICD-10-CM | POA: Diagnosis not present

## 2022-03-08 DIAGNOSIS — L84 Corns and callosities: Secondary | ICD-10-CM

## 2022-03-08 DIAGNOSIS — M79675 Pain in left toe(s): Secondary | ICD-10-CM | POA: Diagnosis not present

## 2022-03-08 DIAGNOSIS — M2041 Other hammer toe(s) (acquired), right foot: Secondary | ICD-10-CM | POA: Diagnosis not present

## 2022-03-08 DIAGNOSIS — M79674 Pain in right toe(s): Secondary | ICD-10-CM | POA: Diagnosis not present

## 2022-03-08 DIAGNOSIS — E119 Type 2 diabetes mellitus without complications: Secondary | ICD-10-CM

## 2022-03-08 DIAGNOSIS — B351 Tinea unguium: Secondary | ICD-10-CM

## 2022-03-08 DIAGNOSIS — M2042 Other hammer toe(s) (acquired), left foot: Secondary | ICD-10-CM

## 2022-03-08 MED ORDER — DOXYCYCLINE HYCLATE 100 MG PO CAPS: 100.0000 mg | ORAL_CAPSULE | Freq: Two times a day (BID) | ORAL | 0 refills | Status: AC

## 2022-03-08 NOTE — Progress Notes (Signed)
  Subjective:  Patient ID: Jonathon Lin, male    DOB: 07-02-1956,  MRN: [id number]  [place] presents to clinic today for at risk foot care. Pt has h/o NIDDM with PAD and preulcerative lesion(s) right lower extremity and painful mycotic toenails that limit ambulation. Painful toenails interfere with ambulation. Aggravating factors include wearing enclosed shoe gear. Pain is relieved with periodic professional debridement. Painful porokeratotic lesions are aggravated when weightbearing with and without shoegear. Pain is relieved with periodic professional debridement.  Patient states blood glucose was 146 mg/dl today.  Last A1c was 7.3%.  Patient states she has had some swelling in his right LE. States he was taken off of pioglitazone by his PCP, but he is still on amlodipine. He denies any h/o fever, chills, night sweats, nausea or vomiting.  He also relates h/o osteomyelitis of the right 2nd digit. "Dr. Cannon Kettle saved my toe". He states he was on several courses of antibiotics. His last MRI was negative for osteo of the right 2nd toe. He states toe stays swollen.         PCP is Asencion Noble, MD , and last visit was 3 weeks ago per patient recollection.  Review of Systems: Negative except as noted in the HPI. Objective:   Vascular Examination: Vascular status intact b/l with palpable pedal pulses. Pedal hair sparse b/l. CFT immediate b/l. No edema. No pain with calf compression b/l. Skin temperature gradient with increased warmth RLE and WNL LLE. Nonpitting edema noted BLE.  Neurological Examination: Sensation grossly intact b/l with 10 gram monofilament. Vibratory sensation intact b/l.   Dermatological Examination: Pedal skin warm and supple. Toenails 1-5 b/l thick, discolored, elongated with subungual debris and pain on dorsal palpation. . Preulcerative lesion noted distal tip of right 2nd toe and distal tip of right 3rd toe. There is visible subdermal hemorrhage. There is no  surrounding erythema, no edema, no drainage, no odor, no fluctuance.  Musculoskeletal Examination: Muscle strength 5/5 to b/l LE. Hammertoe deformity noted 2-5 b/l.  Radiographs: None  Assessment/Plan: 1. Pain due to onychomycosis of toenails of both feet   2. Pre-ulcerative corn or callous   3. Cellulitis of right lower extremity   4. Acquired hammertoes of both feet   5. Diabetes mellitus without complication ([place])   6. Encounter for diabetic foot exam [facility])     -Patient was evaluated and treated. All patient's and/or POA's questions/concerns answered on today's visit. -Examined patient. -His right leg is warm, so I will start him on doxycycline 100 mg po bid x 7 days. He has no calf pain. Will have him follow up with Dr. Sherryle Lis for reassessment. Patient states right 2nd toe has not changed in size.. -Diabetic foot examination performed today. -Patient to continue soft, supportive shoe gear daily. -Mycotic toenails 1-5 bilaterally were debrided in length and girth with sterile nail nippers and dremel without incident. -Preulcerative lesion pared distal tip of right 2nd toe and distal tip of right 3rd toe. Total number pared=2. -Patient/POA to call should there be question/concern in the interim.   Return in about 1 week (around 03/15/2022).  Marzetta Board, DPM

## 2022-03-15 ENCOUNTER — Ambulatory Visit (INDEPENDENT_AMBULATORY_CARE_PROVIDER_SITE_OTHER): Payer: PPO | Admitting: Podiatry

## 2022-03-15 ENCOUNTER — Ambulatory Visit (INDEPENDENT_AMBULATORY_CARE_PROVIDER_SITE_OTHER): Payer: PPO

## 2022-03-15 DIAGNOSIS — B351 Tinea unguium: Secondary | ICD-10-CM | POA: Diagnosis not present

## 2022-03-15 DIAGNOSIS — M79675 Pain in left toe(s): Secondary | ICD-10-CM | POA: Diagnosis not present

## 2022-03-15 DIAGNOSIS — M2042 Other hammer toe(s) (acquired), left foot: Secondary | ICD-10-CM

## 2022-03-15 DIAGNOSIS — L03115 Cellulitis of right lower limb: Secondary | ICD-10-CM

## 2022-03-15 DIAGNOSIS — M2041 Other hammer toe(s) (acquired), right foot: Secondary | ICD-10-CM

## 2022-03-15 DIAGNOSIS — M79674 Pain in right toe(s): Secondary | ICD-10-CM

## 2022-03-15 MED ORDER — CEPHALEXIN 500 MG PO CAPS: 500.0000 mg | ORAL_CAPSULE | Freq: Four times a day (QID) | ORAL | 0 refills | Status: AC

## 2022-03-20 ENCOUNTER — Encounter: Payer: Self-pay | Admitting: Podiatry

## 2022-03-20 NOTE — Progress Notes (Signed)
  Subjective:  Patient ID: Jonathon Lin, male    DOB: [birth year],  MRN: [id number]  Chief Complaint  Patient presents with   Foot Problem    cellulitis right lower extremity and history of osteomyelitis right 2nd digit, numbness/tingling in bilateral feet     66 y.o. male presents with the above complaint. History confirmed with patient.  He has completed the Keflex, the leg is still red and swollen  Objective:  Physical Exam: warm, good capillary refill, no trophic changes or ulcerative lesions, normal DP and PT pulses, normal sensory exam, and erythematous cellulitic rash along the right leg that is tender, no deep pain in the calf, negative Homan and Pratt sign      Right foot x-rays were taken there is no clear evidence of osteomyelitis no signs of infection  Assessment:   1. Pain due to onychomycosis of toenails of both feet      Plan:  Patient was evaluated and treated and all questions answered.   Discussed with him that he likely does have cellulitis and I recommended increasing Keflex dosing to 4 times daily.  Rx sent to pharmacy.  I will see him back in 2 weeks for follow-up.  No evidence of DVT or deeper infection currently  Return in about 2 weeks (around 03/29/2022) for f/u cellulitis.

## 2022-03-29 ENCOUNTER — Ambulatory Visit: Payer: HMO | Admitting: Podiatry

## 2022-03-29 DIAGNOSIS — L03115 Cellulitis of right lower limb: Secondary | ICD-10-CM | POA: Diagnosis not present

## 2022-03-29 NOTE — Progress Notes (Signed)
  Subjective:  Patient ID: Jonathon Lin, male    DOB: [birth year],  MRN: [id number]  Chief Complaint  Patient presents with   Cellulitis    Cellulitis right lower extremity and history of osteomyelitis right 2nd digit- patient is currently taking Keflex- patient has missed a couple of doses. Patient stated he is doing a little better but not completely gone.     66 y.o. male presents with the above complaint. History confirmed with patient.  Has not seen much improvement with the Keflex he has 1 or 2 days left  Objective:  Physical Exam: warm, good capillary refill, no trophic changes or ulcerative lesions, normal DP and PT pulses, normal sensory exam, and he still has erythema and swelling throughout the right lower extremity, no deep pain in the calf, negative Homan and Pratt sign      Right foot x-rays were taken there is no clear evidence of osteomyelitis no signs of infection  Assessment:   1. Cellulitis of right lower extremity      Plan:  Patient was evaluated and treated and all questions answered.  He will complete the Keflex.  I did order lab work to evaluate if there is a deeper infection and also get what she has not been checked for.  No known personal or family history as far as he knows.  I will let him know what his lab work shows and we will discuss further treatment there after  Return will call w/ lab results.

## 2022-03-30 LAB — CBC WITH DIFFERENTIAL/PLATELET
Basophils Absolute: 0 10*3/uL (ref 0.0–0.2)
Basos: 1 %
EOS (ABSOLUTE): 0.2 10*3/uL (ref 0.0–0.4)
Eos: 2 %
Hematocrit: 39.7 % (ref 37.5–51.0)
Hemoglobin: 14 g/dL (ref 13.0–17.7)
Immature Grans (Abs): 0 10*3/uL (ref 0.0–0.1)
Immature Granulocytes: 0 %
Lymphocytes Absolute: 0.9 10*3/uL (ref 0.7–3.1)
Lymphs: 14 %
MCH: 35.4 pg — ABNORMAL HIGH (ref 26.6–33.0)
MCHC: 35.3 g/dL (ref 31.5–35.7)
MCV: 101 fL — ABNORMAL HIGH (ref 79–97)
Monocytes Absolute: 0.7 10*3/uL (ref 0.1–0.9)
Monocytes: 11 %
Neutrophils Absolute: 4.5 10*3/uL (ref 1.4–7.0)
Neutrophils: 72 %
Platelets: 174 10*3/uL (ref 150–450)
RBC: 3.95 x10E6/uL — ABNORMAL LOW (ref 4.14–5.80)
RDW: 12.9 % (ref 11.6–15.4)
WBC: 6.2 10*3/uL (ref 3.4–10.8)

## 2022-03-30 LAB — SEDIMENTATION RATE: Sed Rate: 21 mm/hr (ref 0–30)

## 2022-03-30 LAB — C-REACTIVE PROTEIN: CRP: 17 mg/L — ABNORMAL HIGH (ref 0–10)

## 2022-03-30 LAB — URIC ACID: Uric Acid: 8.4 mg/dL (ref 3.8–8.4)

## 2022-03-30 MED ORDER — COLCHICINE 0.6 MG PO TABS: ORAL_TABLET | ORAL | 2 refills | Status: DC

## 2022-03-30 NOTE — Progress Notes (Signed)
Spoke with patient giving lab results/ instructions per  physician, verbalized understanding.

## 2022-03-30 NOTE — Addendum Note (Signed)
Addended bySherryle Lis, Olar Santini R on: 03/30/2022 07:54 AM   Modules accepted: Orders

## 2022-04-07 ENCOUNTER — Telehealth: Payer: Self-pay

## 2022-04-07 MED ORDER — METHYLPREDNISOLONE 4 MG PO TBPK: ORAL_TABLET | ORAL | 0 refills | Status: AC

## 2022-04-07 NOTE — Telephone Encounter (Signed)
Patient is aware and verbalized understanding of information given. 

## 2022-05-05 ENCOUNTER — Telehealth: Payer: Self-pay | Admitting: *Deleted

## 2022-05-05 ENCOUNTER — Other Ambulatory Visit: Payer: Self-pay | Admitting: Podiatry

## 2022-05-05 MED ORDER — METHYLPREDNISOLONE 4 MG PO TBPK: ORAL_TABLET | ORAL | 0 refills | Status: AC

## 2022-05-05 NOTE — Telephone Encounter (Signed)
Patient is calling because the right foot is still swelling, colchicine did not seem to help with the swelling,is there something else to prescribe?

## 2022-05-09 ENCOUNTER — Other Ambulatory Visit: Payer: Self-pay | Admitting: Podiatry

## 2022-05-11 ENCOUNTER — Telehealth: Payer: Self-pay | Admitting: Podiatry

## 2022-05-11 NOTE — Telephone Encounter (Signed)
LVM for pt to call to make an appointment to be seen.

## 2022-06-20 ENCOUNTER — Ambulatory Visit: Payer: PPO | Admitting: Podiatry

## 2022-07-05 ENCOUNTER — Other Ambulatory Visit: Payer: Self-pay | Admitting: *Deleted

## 2022-07-05 NOTE — Patient Outreach (Signed)
  Care Coordination   07/05/2022  Name: Jonathon Lin MRN: [id number] DOB: [birth year]   Care Coordination Outreach Attempts:  An unsuccessful telephone outreach was attempted today to offer the patient information about available care coordination services as a benefit of their health plan. HIPAA compliant messages left on voicemail, providing contact information for CSW, encouraging patient to return CSW's call at his earliest convenience.  Follow Up Plan:  Additional outreach attempts will be made to offer the patient care coordination information and services.   Encounter Outcome:  No Answer.   Care Coordination Interventions Activated:  No.    Care Coordination Interventions:  No, not indicated.    Nat Christen, BSW, MSW, LCSW  Licensed Education officer, environmental [facility]  Mailing [place] [address] [place], [place] [address] Physical Address-[address], [place],  [address] Toll Free Main # [phone] Fax # [phone] Cell # [phone] [email or web address]

## 2022-07-12 ENCOUNTER — Ambulatory Visit: Payer: Self-pay | Admitting: *Deleted

## 2022-07-12 ENCOUNTER — Encounter: Payer: Self-pay | Admitting: *Deleted

## 2022-07-12 NOTE — Patient Outreach (Signed)
  Care Coordination   Initial Visit Note   07/12/2022  Name: Jonathon Lin MRN: [id number] DOB: Feb 07, 1956  Jonathon Lin is a 66 y.o. year old male who sees Jonathon Noble, MD for primary care. I spoke with Jonathon Lin by phone today.  What matters to the patients health and wellness today?   Assessment of Needs.  SDOH assessments and interventions completed:  Yes.  SDOH Interventions Today    Flowsheet Row Most Recent Value  SDOH Interventions   Food Insecurity Interventions Intervention Not Indicated  Housing Interventions Intervention Not Indicated  Transportation Interventions Intervention Not Indicated, Patient Resources (Friends/Family)  Utilities Interventions Intervention Not Indicated  Alcohol Usage Interventions Patient Refused  Financial Strain Interventions Intervention Not Indicated  Physical Activity Interventions Patient Refused  Stress Interventions Intervention Not Indicated  Social Connections Interventions Intervention Not Indicated     Care Coordination Interventions:  Yes, provided.   Follow up plan: No further intervention required.   Encounter Outcome:  Pt. Visit Completed.   Nat Christen, BSW, MSW, LCSW  Licensed Education officer, environmental Health [facility]  Mailing [place] [address], [place], [place] [address] Physical Address-[address] [place], [place] [address] Toll Free Main # [phone] Fax # [phone] Cell # [phone] [email or web address]

## 2022-07-12 NOTE — Patient Instructions (Signed)
Visit Information  Thank you for taking time to visit with me today. Please don't hesitate to contact me if I can be of assistance to you.   Please call the care guide team at [phone] if you need to cancel or reschedule your appointment.   If you are experiencing a Mental Health or Behavioral Health Crisis or need someone to talk to, please call the Suicide and Crisis Lifeline: 988 call the [place] National Suicide Prevention Lifeline: 1-800-273-8255 or TTY: 1-800-799-4 TTY (1-800-799-4889) to talk to a trained counselor call 1-800-273-TALK (toll free, 24 hour hotline) go to [facility] [address], [place] ([phone]) call the [place] Crisis Line: 800-939-9988 call 911  Patient verbalizes understanding of instructions and care plan provided today and agrees to view in MyChart. Active MyChart status and patient understanding of how to access instructions and care plan via MyChart confirmed with patient.     No further follow up required.  Mariska Daffin, BSW, MSW, LCSW  Licensed Clinical Social Worker  [facility] [place] [facility]  Mailing Address-[address], [place], [place] [address] Physical Address-[address], [place], [place] [address] Toll Free Main # [phone] Fax # [phone] Cell # [phone] [email or web address]            

## 2022-10-04 ENCOUNTER — Ambulatory Visit: Payer: HMO | Admitting: Podiatry

## 2022-10-04 ENCOUNTER — Encounter: Payer: Self-pay | Admitting: Podiatry

## 2022-10-04 DIAGNOSIS — M79675 Pain in left toe(s): Secondary | ICD-10-CM | POA: Diagnosis not present

## 2022-10-04 DIAGNOSIS — B351 Tinea unguium: Secondary | ICD-10-CM

## 2022-10-04 DIAGNOSIS — E1151 Type 2 diabetes mellitus with diabetic peripheral angiopathy without gangrene: Secondary | ICD-10-CM | POA: Diagnosis not present

## 2022-10-04 DIAGNOSIS — M79674 Pain in right toe(s): Secondary | ICD-10-CM | POA: Diagnosis not present

## 2022-10-04 NOTE — Progress Notes (Signed)
  Subjective:  Patient ID: Jonathon Lin, male    DOB: Jan 18, 1956,  MRN: [id number]  Jonathon Lin presents to clinic today for at risk foot care. Pt has h/o NIDDM with PAD and painful elongated mycotic toenails 1-5 bilaterally which are tender when wearing enclosed shoe gear. Pain is relieved with periodic professional debridement.   He states his right 2nd digit healed fine with course of antibiotics taken under care of Dr. Lilian Lin. Chief Complaint  Patient presents with   Nail Problem    [facility] BS-did not check today A1C-8.0 Jonathon Lin PCP VST-06/2022   New problem(s): None.   PCP is Jonathon Perches, MD.  Allergies  Allergen Reactions   Amoxicillin-Pot Clavulanate Itching    REACTION: hives   Morphine And Related Other (See Comments)    hypertension   Sildenafil     REACTION: headaches    Review of Systems: Negative except as noted in the HPI.  Objective:  There were no vitals filed for this visit.  Jonathon Lin is a pleasant 67 y.o. male morbidly obese in NAD. AAO x 3.  Vascular Examination: Vascular status intact b/l with palpable pedal pulses. Pedal hair sparse b/l. CFT immediate b/l. No edema. No pain with calf compression b/l. Skin temperature gradient WNL BLE. Nonpitting edema noted BLE.  Neurological Examination: Sensation grossly intact b/l with 10 gram monofilament. Vibratory sensation intact b/l.   Dermatological Examination: Pedal skin warm and supple. Toenails 1-5 b/l thick, discolored, elongated with subungual debris and pain on dorsal palpation.   Dried heme noted on left 2nd digit from toenail getting caught in sock. No underlying erythema, no edema, no drainage nor fluctuance.  Musculoskeletal Examination: Muscle strength 5/5 to b/l LE. Hammertoe deformity noted 2-5 b/l.  Radiographs: None  Assessment/Plan: 1. Pain due to onychomycosis of toenails of both feet   2. Type II diabetes mellitus with peripheral circulatory disorder (HCC)   -Consent given  for treatment as described below: -Examined patient. -Left 2nd toe healed with no acute findings today. -Continue foot and shoe inspections daily. Monitor blood glucose per PCP/Endocrinologist's recommendations. -Toenails 1-5 b/l were debrided in length and girth with sterile nail nippers and dremel without iatrogenic bleeding.  -Patient/POA to call should there be question/concern in the interim.   Return in about 3 months (around 01/02/2023).  Jonathon Lin, DPM

## 2022-10-13 ENCOUNTER — Encounter: Payer: Self-pay | Admitting: Radiology

## 2023-01-24 ENCOUNTER — Encounter: Payer: Self-pay | Admitting: Podiatry

## 2023-01-24 ENCOUNTER — Ambulatory Visit (INDEPENDENT_AMBULATORY_CARE_PROVIDER_SITE_OTHER): Payer: HMO | Admitting: Podiatry

## 2023-01-24 VITALS — BP 159/88 | HR 96

## 2023-01-24 DIAGNOSIS — M79675 Pain in left toe(s): Secondary | ICD-10-CM | POA: Diagnosis not present

## 2023-01-24 DIAGNOSIS — M79674 Pain in right toe(s): Secondary | ICD-10-CM

## 2023-01-24 DIAGNOSIS — L600 Ingrowing nail: Secondary | ICD-10-CM | POA: Diagnosis not present

## 2023-01-24 DIAGNOSIS — B351 Tinea unguium: Secondary | ICD-10-CM

## 2023-01-24 DIAGNOSIS — E1151 Type 2 diabetes mellitus with diabetic peripheral angiopathy without gangrene: Secondary | ICD-10-CM | POA: Diagnosis not present

## 2023-01-24 DIAGNOSIS — L84 Corns and callosities: Secondary | ICD-10-CM

## 2023-01-29 NOTE — Progress Notes (Signed)
  Subjective:  Patient ID: Jonathon Lin, male    DOB: [birth year],  MRN: [id number]  Jonathon Lin presents to clinic today for at risk foot care. Pt has h/o NIDDM with PAD and preulcerative lesion(s) right foot and painful mycotic toenails that limit ambulation. Painful toenails interfere with ambulation. Aggravating factors include wearing enclosed shoe gear. Pain is relieved with periodic professional debridement. Painful preulcerative lesion(s) is/are aggravated when weightbearing with and without shoegear. Pain is relieved with periodic professional debridement.  Chief Complaint  Patient presents with   diabitic foot care     A1c 9- Nov/Dec by PCP   New problem(s): None.   PCP is Carylon Perches, MD.  Allergies  Allergen Reactions   Amoxicillin-Pot Clavulanate Itching    REACTION: hives   Morphine And Codeine Other (See Comments)    hypertension   Sildenafil     REACTION: headaches    Review of Systems: Negative except as noted in the HPI.  Objective:  Vitals:   01/24/23 1504  BP: (!) 159/88  Pulse: 96  SpO2: 97%   Jonathon Lin is a pleasant 67 y.o. male morbidly obese in NAD. AAO x 3.  Vascular Examination: Vascular status intact b/l with palpable pedal pulses. Pedal hair sparse b/l. CFT immediate b/l. No edema. No pain with calf compression b/l. Skin temperature gradient WNL BLE. Nonpitting edema noted BLE.  Neurological Examination: Sensation grossly intact b/l with 10 gram monofilament. Vibratory sensation intact b/l.   Dermatological Examination: Pedal skin warm and supple. Toenails 1-5 b/l thick, discolored, elongated with subungual debris and pain on dorsal palpation.   There is evidence of subacute paronychia medial border right great toe with incurvated nailplate and mild nail border hypertrophy. There is blanchable erythema, no edema, . Preulcerative lesion noted distal tip of right 2nd toe and distal tip of right 3rd toe. There is visible subdermal hemorrhage. There  is no surrounding erythema, no edema, no drainage, no odor, no fluctuance.  Musculoskeletal Examination: Muscle strength 5/5 to b/l LE. Hammertoe deformity noted 2-5 b/l.  Radiographs: None  Assessment/Plan: 1. Pain due to onychomycosis of toenails of both feet   2. Ingrown toenail without infection   3. Pre-ulcerative corn or callous   4. Type II diabetes mellitus with peripheral circulatory disorder [facility])     -Patient was evaluated and treated. All patient's and/or POA's questions/concerns answered on today's visit. -Toenails were debrided in length and girth 2-5 bilaterally and left great toe with sterile nail nippers and dremel without iatrogenic bleeding.  -No invasive procedure(s) performed. Offending nail border debrided and curretaged medial border right hallux utilizing sterile nail nipper and currette. Border(s) cleansed with alcohol and triple antibiotic ointment applied. Patient/POA/Caregiver/Facility instructed to apply Neosporin Cream  to R hallux once daily for 7 days. Call office if there are any concerns. -Patient/POA to call should there be question/concern in the interim.   Return in about 3 months (around 04/26/2023).  Freddie Breech, DPM

## 2023-04-12 ENCOUNTER — Ambulatory Visit
Admission: RE | Admit: 2023-04-12 | Discharge: 2023-04-12 | Disposition: A | Discharge status: Home / Self Care | Payer: PPO | Source: Ambulatory Visit | Attending: Family Medicine | Admitting: Family Medicine

## 2023-04-12 VITALS — BP 127/82 | HR 105 | Temp 98.2°F | Resp 13

## 2023-04-12 DIAGNOSIS — Z1152 Encounter for screening for COVID-19: Secondary | ICD-10-CM | POA: Insufficient documentation

## 2023-04-12 DIAGNOSIS — J029 Acute pharyngitis, unspecified: Secondary | ICD-10-CM

## 2023-04-12 LAB — POCT RAPID STREP A (OFFICE): Rapid Strep A Screen: NEGATIVE

## 2023-04-12 MED ORDER — LIDOCAINE VISCOUS HCL 2 % MT SOLN: 10.0000 mL | OROMUCOSAL | 0 refills | Status: DC | PRN

## 2023-04-12 NOTE — ED Triage Notes (Signed)
Pt c/o Sore throat started yesterday swollen lymph nodes on the left side of the neck.

## 2023-04-12 NOTE — ED Provider Notes (Signed)
[facility]    CSN: [id number] Arrival date & time: 04/12/23  1249      History   Chief Complaint Chief Complaint  Patient presents with   Sore Throat    Entered by patient    HPI Jonathon Lin is a 67 y.o. male.   Patient presenting today with 1 day history of sore throat, swollen lymph nodes.  Denies fever, chills, cough, congestion, chest pain, shortness of breath, abdominal pain, nausea vomiting or diarrhea.  So for now try anything over-the-counter for symptoms.  No known sick contacts recently.    Past Medical History:  Diagnosis Date   Diabetes mellitus without complication (HCC)    High cholesterol    Hypertension    Sleep apnea     Patient Active Problem List   Diagnosis Date Noted   COVID-19 10/06/2020   Allergic reaction 07/21/2016   Adenomatous colon polyp 08/10/2015   VIRAL URI 09/22/2009   DEPRESSION 05/26/2009   ALLERGIC RHINITIS 05/26/2009   Hypothyroidism 03/19/2009   OTHER SPECIFIED DISEASE OF NAIL 03/17/2009   PANIC ATTACK 02/10/2009   COUGH 02/10/2009   INTERTRIGO, CANDIDAL 11/11/2008   DIABETES MELLITUS II, UNCOMPLICATED 05/23/2006   HYPERLIPIDEMIA 05/23/2006   OBESITY, NOS 05/23/2006   ERECTILE DYSFUNCTION 05/23/2006   HYPERTENSION, BENIGN SYSTEMIC 05/23/2006   SEBORRHEIC DERMATITIS, NOS 05/23/2006   Sleep apnea 05/23/2006    Past Surgical History:  Procedure Laterality Date   HAND SURGERY         Home Medications    Prior to Admission medications   Medication Sig Start Date End Date Taking? Authorizing Provider  lidocaine (XYLOCAINE) 2 % solution Use as directed 10 mLs in the mouth or throat every 3 (three) hours as needed. 04/12/23  Yes Particia Nearing, PA-C  amLODipine (NORVASC) 5 MG tablet Take 1 tablet (5 mg total) by mouth daily. 07/22/16   Alison Murray, MD  aspirin EC 81 MG tablet Take 81 mg by mouth daily.    [provider]  Bacitracin-Polymyxin B Ivory Broad) [id number] UNIT/GM OINT Apply  topically 3 (three) times daily as needed. 11/07/20   [provider]  celecoxib (CELEBREX) 200 MG capsule Take 200 mg by mouth daily. 12/22/20   [provider]  chlorthalidone (HYGROTON) 25 MG tablet Take 25 mg by mouth every morning. 11/05/20   [provider]  colchicine 0.6 MG tablet Take 1.2mg  (2 tablets) then 0.6mg  (1 tablet) 1 hour after. Then, take 1 tablet every day for 7 days. 03/30/22   McDonald, Rachelle Hora, DPM  glimepiride (AMARYL) 2 MG tablet Take 1 tablet by mouth daily. 06/16/16   [provider]  ibuprofen (ADVIL,MOTRIN) 200 MG tablet Take 600 mg by mouth every 6 (six) hours as needed for moderate pain.    [provider]  metFORMIN (GLUCOPHAGE) 1000 MG tablet Take 1,000 mg by mouth 2 (two) times daily with a meal.    [provider]  methylPREDNISolone (MEDROL DOSEPAK) 4 MG TBPK tablet 6 day dose pack - take as directed 04/07/22   Edwin Cap, DPM  methylPREDNISolone (MEDROL DOSEPAK) 4 MG TBPK tablet Take as directed 05/05/22   Candelaria Stagers, DPM  sertraline (ZOLOFT) 50 MG tablet Take 1 tablet by mouth at bedtime. OUT OF THIS FOR ONE WEEK 07/12/16   [provider]  simvastatin (ZOCOR) 20 MG tablet Take 20 mg by mouth at bedtime. 12/14/20   [provider]  TRULICITY 0.75 MG/0.5ML SOPN Inject 0.75 mg into  the skin once a week. 11/05/20   [provider]  valsartan (DIOVAN) 80 MG tablet Take 80 mg by mouth at bedtime. 11/03/20   [provider]  diphenhydrAMINE (BENADRYL) 25 MG tablet Take 1 tablet (25 mg total) by mouth every 8 (eight) hours as needed for allergies. 07/22/16 12/23/19  Dorothea Ogle, MD    Family History History reviewed. No pertinent family history.  Social History Social History   Tobacco Use   Smoking status: Former    Passive exposure: Past   Smokeless tobacco: Never   Tobacco comments:    QUIT 8 YEARS AGO  Vaping Use   Vaping status: Never Used  Substance Use Topics    Alcohol use: Yes    Alcohol/week: 98.0 standard drinks of alcohol    Types: 98 Cans of beer per week    Comment: 14  beers daily   Drug use: No     Allergies   Amoxicillin-pot clavulanate, Morphine and codeine, and Sildenafil   Review of Systems Review of Systems Per HPI  Physical Exam Triage Vital Signs ED Triage Vitals  Encounter Vitals Group     BP 04/12/23 1258 127/82     Systolic BP Percentile --      Diastolic BP Percentile --      Pulse Rate 04/12/23 1258 (!) 105     Resp 04/12/23 1258 13     Temp 04/12/23 1258 98.2 F (36.8 C)     Temp Source 04/12/23 1258 Oral     SpO2 04/12/23 1258 96 %     Weight --      Height --      Head Circumference --      Peak Flow --      Pain Score 04/12/23 1300 3     Pain Loc --      Pain Education --      Exclude from Growth Chart --    No data found.  Updated Vital Signs BP 127/82 (BP Location: Right Arm)   Pulse (!) 105   Temp 98.2 F (36.8 C) (Oral)   Resp 13   SpO2 96%   Visual Acuity Right Eye Distance:   Left Eye Distance:   Bilateral Distance:    Right Eye Near:   Left Eye Near:    Bilateral Near:     Physical Exam Vitals and nursing note reviewed.  Constitutional:      Appearance: He is well-developed.  HENT:     Head: Atraumatic.     Right Ear: External ear normal.     Left Ear: External ear normal.     Nose: Nose normal.     Mouth/Throat:     Pharynx: Posterior oropharyngeal erythema present. No oropharyngeal exudate.  Eyes:     Conjunctiva/sclera: Conjunctivae normal.     Pupils: Pupils are equal, round, and reactive to light.  Cardiovascular:     Rate and Rhythm: Normal rate and regular rhythm.  Pulmonary:     Effort: Pulmonary effort is normal. No respiratory distress.     Breath sounds: No wheezing or rales.  Musculoskeletal:        General: Normal range of motion.     Cervical back: Normal range of motion and neck supple.  Lymphadenopathy:     Cervical: No cervical adenopathy.   Skin:    General: Skin is warm and dry.  Neurological:     Mental Status: He is alert and oriented to person, place, and time.  Psychiatric:        Behavior: Behavior normal.      UC Treatments / Results  Labs (all labs ordered are listed, but only abnormal results are displayed) Labs Reviewed  SARS CORONAVIRUS 2 (TAT 6-24 HRS)  POCT RAPID STREP A (OFFICE)    EKG   Radiology No results found.  Procedures Procedures (including critical care time)  Medications Ordered in UC Medications - No data to display  Initial Impression / Assessment and Plan / UC Course  I have reviewed the triage vital signs and the nursing notes.  Pertinent labs & imaging results that were available during my care of the patient were reviewed by me and considered in my medical decision making (see chart for details).     Rapid strep negative, COVID testing pending.  Discussed viscous lidocaine, salt gargles, supportive over-the-counter medications and home care.  Return for worsening symptoms.  Final Clinical Impressions(s) / UC Diagnoses   Final diagnoses:  Acute pharyngitis, unspecified etiology   Discharge Instructions   None    ED Prescriptions     Medication Sig Dispense Auth. Provider   lidocaine (XYLOCAINE) 2 % solution Use as directed 10 mLs in the mouth or throat every 3 (three) hours as needed. 100 mL Particia Nearing, [place]      PDMP not reviewed this encounter.   Particia Nearing, [place] 04/12/23 1423

## 2023-04-13 LAB — SARS CORONAVIRUS 2 (TAT 6-24 HRS): SARS Coronavirus 2: NEGATIVE

## 2023-05-01 ENCOUNTER — Ambulatory Visit: Payer: HMO | Admitting: Podiatry

## 2023-05-01 ENCOUNTER — Encounter: Payer: Self-pay | Admitting: Podiatry

## 2023-05-01 ENCOUNTER — Ambulatory Visit (INDEPENDENT_AMBULATORY_CARE_PROVIDER_SITE_OTHER): Payer: HMO | Admitting: Podiatry

## 2023-05-01 DIAGNOSIS — L84 Corns and callosities: Secondary | ICD-10-CM

## 2023-05-01 DIAGNOSIS — B351 Tinea unguium: Secondary | ICD-10-CM | POA: Diagnosis not present

## 2023-05-01 DIAGNOSIS — E1151 Type 2 diabetes mellitus with diabetic peripheral angiopathy without gangrene: Secondary | ICD-10-CM

## 2023-05-01 DIAGNOSIS — M79675 Pain in left toe(s): Secondary | ICD-10-CM

## 2023-05-01 DIAGNOSIS — M79674 Pain in right toe(s): Secondary | ICD-10-CM | POA: Diagnosis not present

## 2023-05-01 NOTE — Progress Notes (Unsigned)
Subjective:  Patient ID: Jonathon Lin, male    DOB: [birth year],  MRN: [id number]  Jonathon Lin presents to clinic today for:  Chief Complaint  Patient presents with   Routine foot care   Patient notes nails are thick and elongated, causing pain in shoe gear when ambulating.  He gets a painful corn on the right 3rd toe.  PCP is Carylon Perches, MD.  Last seen 2 months ago.  Past Medical History:  Diagnosis Date   Diabetes mellitus without complication (HCC)    High cholesterol    Hypertension    Sleep apnea     Allergies  Allergen Reactions   Amoxicillin-Pot Clavulanate Itching    REACTION: hives   Morphine And Codeine Other (See Comments)    hypertension   Sildenafil     REACTION: headaches    Objective:  There were no vitals filed for this visit.  Jonathon Lin is a pleasant 67 y.o. male in NAD. AAO x 3.  Vascular Examination: Patient has palpable DP pulse, absent PT pulse bilateral.  Delayed capillary refill bilateral toes.  Sparse digital hair bilateral.  Proximal to distal cooling WNL bilateral.  Mild edema to lower legs and ankles.  Dermatological Examination: Interspaces are clear with no open lesions noted bilateral.  Skin is shiny and atrophic bilateral.  Nails are 3-15mm thick, with yellowish/brown discoloration, subungual debris and distal onycholysis x10.  There is pain with compression of nails x10.  There are hyperkeratotic lesions noted on dorsal aspect of right 3rd toe PIPJ .  Patient qualifies for at-risk foot care because of Diabetes with PVD, pain in nails .  Assessment/Plan: 1. Type II diabetes mellitus with peripheral circulatory disorder (HCC)   2. Pre-ulcerative corn or callous   3. Pain due to onychomycosis of toenails of both feet    Mycotic nails x10 were sharply debrided with sterile nail nippers and power debriding burr to decrease bulk and length.  Hyperkeratotic lesion x1 was shaved with sterile #312 blade.  A [employer] prior  auth request form will be sent to his insurance for the corn shaving today.   Return in about 3 months (around 07/31/2023) for [facility].   Clerance Lav, DPM, FACFAS [facility]     [address] [place], [place] [address]                Office [phone]  Fax [phone]

## 2023-07-31 ENCOUNTER — Ambulatory Visit: Payer: PPO | Admitting: Podiatry

## 2023-08-22 DIAGNOSIS — Z125 Encounter for screening for malignant neoplasm of prostate: Secondary | ICD-10-CM | POA: Diagnosis not present

## 2023-08-22 DIAGNOSIS — Z79899 Other long term (current) drug therapy: Secondary | ICD-10-CM | POA: Diagnosis not present

## 2023-08-22 DIAGNOSIS — E1129 Type 2 diabetes mellitus with other diabetic kidney complication: Secondary | ICD-10-CM | POA: Diagnosis not present

## 2023-08-29 DIAGNOSIS — E785 Hyperlipidemia, unspecified: Secondary | ICD-10-CM | POA: Diagnosis not present

## 2023-08-29 DIAGNOSIS — I10 Essential (primary) hypertension: Secondary | ICD-10-CM | POA: Diagnosis not present

## 2023-08-29 DIAGNOSIS — E1129 Type 2 diabetes mellitus with other diabetic kidney complication: Secondary | ICD-10-CM | POA: Diagnosis not present

## 2023-08-29 DIAGNOSIS — R739 Hyperglycemia, unspecified: Secondary | ICD-10-CM | POA: Diagnosis not present

## 2023-08-30 ENCOUNTER — Other Ambulatory Visit: Payer: Self-pay

## 2023-08-30 DIAGNOSIS — E118 Type 2 diabetes mellitus with unspecified complications: Secondary | ICD-10-CM

## 2023-09-06 ENCOUNTER — Other Ambulatory Visit: Payer: Self-pay

## 2023-09-06 NOTE — Progress Notes (Signed)
   09/06/2023  Patient ID: Jonathon Lin, male   DOB: [birth year], 68 y.o.   MRN: [id number]    2025 Medication Assistance Application Summary:  Patient was outreached regarding medication assistance for 2025. Verified address, insurance for 2025, and income. Patient qualifies for PAP for 2025 for Ozempic, no other new medications were identified for medication assistance.    Medication Assistance Findings:  Medication assistance needs identified: Ozempic     Additional medication assistance options reviewed with patient as warranted:  No other options identified  Plan: I will route patient assistance letter to pharmacy technician who will coordinate patient assistance program application process for medications listed above.  Pharmacy technician will assist with obtaining all required documents from both patient and provider(s) and submit application(s) once completed.    Thank you for allowing pharmacy to be a part of this patient's care.   Harlon Flor, PharmD Clinical Pharmacist  [phone]

## 2023-09-08 ENCOUNTER — Ambulatory Visit
Admission: RE | Admit: 2023-09-08 | Discharge: 2023-09-08 | Disposition: A | Discharge status: Home / Self Care | Payer: PPO | Source: Ambulatory Visit | Attending: Family Medicine

## 2023-09-08 VITALS — BP 143/84 | HR 111 | Temp 96.9°F | Resp 20

## 2023-09-08 DIAGNOSIS — K1120 Sialoadenitis, unspecified: Secondary | ICD-10-CM

## 2023-09-08 LAB — POCT RAPID STREP A (OFFICE): Rapid Strep A Screen: NEGATIVE

## 2023-09-08 MED ORDER — CLINDAMYCIN HCL 300 MG PO CAPS: 300.0000 mg | ORAL_CAPSULE | Freq: Two times a day (BID) | ORAL | 0 refills | Status: DC

## 2023-09-08 NOTE — ED Triage Notes (Signed)
Pt reports he has a sore throat with a hard knot on the left side of his neck x 4 days

## 2023-09-08 NOTE — ED Provider Notes (Signed)
[facility]    CSN: [id number] Arrival date & time: 09/08/23  1059      History   Chief Complaint Chief Complaint  Patient presents with   Sore Throat    Entered by patient    HPI Jonathon Lin is a 68 y.o. male.   Patient presenting today with a painful hard mass to the left side of neck just below jaw for the past 4 days.  Denies significant sore throat, congestion, cough, fever, chills, chest pain, shortness of breath.  So far trying over-the-counter pain relievers with minimal relief.    Past Medical History:  Diagnosis Date   Diabetes mellitus without complication (HCC)    High cholesterol    Hypertension    Sleep apnea     Patient Active Problem List   Diagnosis Date Noted   COVID-19 10/06/2020   Allergic reaction 07/21/2016   Adenomatous colon polyp 08/10/2015   Acute upper respiratory infection 09/22/2009   DEPRESSION 05/26/2009   Allergic rhinitis 05/26/2009   Hypothyroidism 03/19/2009   OTHER SPECIFIED DISEASE OF NAIL 03/17/2009   PANIC ATTACK 02/10/2009   COUGH 02/10/2009   INTERTRIGO, CANDIDAL 11/11/2008   DIABETES MELLITUS II, UNCOMPLICATED 05/23/2006   HYPERLIPIDEMIA 05/23/2006   OBESITY, NOS 05/23/2006   ERECTILE DYSFUNCTION 05/23/2006   HYPERTENSION, BENIGN SYSTEMIC 05/23/2006   SEBORRHEIC DERMATITIS, NOS 05/23/2006   Sleep apnea 05/23/2006    Past Surgical History:  Procedure Laterality Date   HAND SURGERY         Home Medications    Prior to Admission medications   Medication Sig Start Date End Date Taking? Authorizing Provider  clindamycin (CLEOCIN) 300 MG capsule Take 1 capsule (300 mg total) by mouth 2 (two) times daily. 09/08/23  Yes Particia Nearing, PA-C  amLODipine (NORVASC) 5 MG tablet Take 1 tablet (5 mg total) by mouth daily. 07/22/16   Alison Murray, MD  aspirin EC 81 MG tablet Take 81 mg by mouth daily.    [provider]  Bacitracin-Polymyxin B Ivory Broad) [id number] UNIT/GM OINT Apply  topically 3 (three) times daily as needed. 11/07/20   [provider]  celecoxib (CELEBREX) 200 MG capsule Take 200 mg by mouth daily. 12/22/20   [provider]  chlorthalidone (HYGROTON) 25 MG tablet Take 25 mg by mouth every morning. 11/05/20   [provider]  colchicine 0.6 MG tablet Take 1.2mg  (2 tablets) then 0.6mg  (1 tablet) 1 hour after. Then, take 1 tablet every day for 7 days. 03/30/22   McDonald, Rachelle Hora, DPM  glimepiride (AMARYL) 2 MG tablet Take 1 tablet by mouth daily. 06/16/16   [provider]  ibuprofen (ADVIL,MOTRIN) 200 MG tablet Take 600 mg by mouth every 6 (six) hours as needed for moderate pain.    [provider]  lidocaine (XYLOCAINE) 2 % solution Use as directed 10 mLs in the mouth or throat every 3 (three) hours as needed. 04/12/23   Particia Nearing, PA-C  metFORMIN (GLUCOPHAGE) 1000 MG tablet Take 1,000 mg by mouth 2 (two) times daily with a meal.    [provider]  methylPREDNISolone (MEDROL DOSEPAK) 4 MG TBPK tablet 6 day dose pack - take as directed 04/07/22   Edwin Cap, DPM  methylPREDNISolone (MEDROL DOSEPAK) 4 MG TBPK tablet Take as directed 05/05/22   Candelaria Stagers, DPM  sertraline (ZOLOFT) 50 MG tablet Take 1 tablet by mouth at bedtime. OUT OF THIS FOR ONE WEEK 07/12/16   [provider]  simvastatin (ZOCOR) 20 MG tablet Take 20 mg by mouth at bedtime. 12/14/20   [provider]  TRULICITY 0.75 MG/0.5ML SOPN Inject 0.75 mg into the skin once a week. 11/05/20   [provider]  valsartan (DIOVAN) 80 MG tablet Take 80 mg by mouth at bedtime. 11/03/20   [provider]  diphenhydrAMINE (BENADRYL) 25 MG tablet Take 1 tablet (25 mg total) by mouth every 8 (eight) hours as needed for allergies. 07/22/16 12/23/19  Dorothea Ogle, MD    Family History History reviewed. No pertinent family history.  Social History Social History   Tobacco Use   Smoking status: Former     Passive exposure: Past   Smokeless tobacco: Never   Tobacco comments:    QUIT 8 YEARS AGO  Vaping Use   Vaping status: Never Used  Substance Use Topics   Alcohol use: Yes    Alcohol/week: 98.0 standard drinks of alcohol    Types: 98 Cans of beer per week    Comment: 14  beers daily   Drug use: No     Allergies   Amoxicillin-pot clavulanate, Morphine and codeine, and Sildenafil   Review of Systems Review of Systems Per HPI  Physical Exam Triage Vital Signs ED Triage Vitals  Encounter Vitals Group     BP 09/08/23 1104 (!) 143/84     Systolic BP Percentile --      Diastolic BP Percentile --      Pulse Rate 09/08/23 1104 (!) 111     Resp 09/08/23 1104 20     Temp 09/08/23 1106 (!) 96.9 F (36.1 C)     Temp Source 09/08/23 1104 Oral     SpO2 09/08/23 1104 94 %     Weight --      Height --      Head Circumference --      Peak Flow --      Pain Score 09/08/23 1106 2     Pain Loc --      Pain Education --      Exclude from Growth Chart --    No data found.  Updated Vital Signs BP (!) 143/84 (BP Location: Right Arm)   Pulse (!) 111   Temp (!) 96.9 F (36.1 C) (Temporal)   Resp 20   SpO2 94%   Visual Acuity Right Eye Distance:   Left Eye Distance:   Bilateral Distance:    Right Eye Near:   Left Eye Near:    Bilateral Near:     Physical Exam Vitals and nursing note reviewed.  Constitutional:      Appearance: He is well-developed.  HENT:     Head: Atraumatic.     Right Ear: External ear normal.     Left Ear: External ear normal.     Nose: Nose normal.     Mouth/Throat:     Mouth: Mucous membranes are moist.     Pharynx: No oropharyngeal exudate or posterior oropharyngeal erythema.     Comments: Significantly edematous, firm, tender mass just below the left jaw suspicious for sialoadenitis Eyes:     Conjunctiva/sclera: Conjunctivae normal.     Pupils: Pupils are equal, round, and reactive to light.  Cardiovascular:     Rate and Rhythm: Normal rate  and regular rhythm.  Pulmonary:     Effort: Pulmonary effort is normal. No respiratory distress.     Breath sounds: No wheezing or rales.  Musculoskeletal:        General:  Normal range of motion.     Cervical back: Normal range of motion and neck supple.  Lymphadenopathy:     Cervical: No cervical adenopathy.  Skin:    General: Skin is warm and dry.  Neurological:     Mental Status: He is alert and oriented to person, place, and time.  Psychiatric:        Behavior: Behavior normal.      UC Treatments / Results  Labs (all labs ordered are listed, but only abnormal results are displayed) Labs Reviewed  POCT RAPID STREP A (OFFICE)    EKG   Radiology No results found.  Procedures Procedures (including critical care time)  Medications Ordered in UC Medications - No data to display  Initial Impression / Assessment and Plan / UC Course  I have reviewed the triage vital signs and the nursing notes.  Pertinent labs & imaging results that were available during my care of the patient were reviewed by me and considered in my medical decision making (see chart for details).     Rapid strep negative, tachycardic and mildly hypertensive in triage, otherwise vital signs reassuring.  Suspect sialoadenitis, will treat with clindamycin, warm compresses, ibuprofen, strict return precautions for worsening symptoms.  Final Clinical Impressions(s) / UC Diagnoses   Final diagnoses:  Sialoadenitis   Discharge Instructions   None    ED Prescriptions     Medication Sig Dispense Auth. Provider   clindamycin (CLEOCIN) 300 MG capsule Take 1 capsule (300 mg total) by mouth 2 (two) times daily. 14 capsule Particia Nearing, [place]      PDMP not reviewed this encounter.   Particia Nearing, [place] 09/08/23 [phone]

## 2023-09-11 ENCOUNTER — Telehealth: Payer: Self-pay | Admitting: Pharmacy Technician

## 2023-09-11 DIAGNOSIS — Z5986 Financial insecurity: Secondary | ICD-10-CM

## 2023-09-11 NOTE — Progress Notes (Addendum)
Pharmacy Medication Assistance Program Note    09/11/2023  Patient ID: Jonathon Lin, male   DOB: 23-Oct-1955, 68 y.o.   MRN: [id number]     09/11/2023  Outreach Medication One  Initial Outreach Date (Medication One) 09/06/2023  Manufacturer Medication One [employer] [facility] Ozempic  Dose of Ozempic 8mg /2ml  Type of Radiographer, therapeutic Assistance  Date Application Sent to Patient 09/13/2023  Application Items Requested Application;Proof of Income;Other  Date Application Sent to Prescriber 09/13/2023  Name of Prescriber Carylon Perches    [facility] 09/29/2023 Successful outreach to patient in regard to Ozempic application. Spoke to patient. HIPAA verified. Patient informs he received the application and will place in mail next week.  Pattricia Boss, CPhT [place]  Office: [phone] Fax: [phone] Email: [name]

## 2023-10-12 ENCOUNTER — Telehealth: Payer: Self-pay | Admitting: Pharmacy Technician

## 2023-10-12 DIAGNOSIS — Z5986 Financial insecurity: Secondary | ICD-10-CM

## 2023-10-12 NOTE — Progress Notes (Signed)
 Pharmacy Medication Assistance Program Note    10/12/2023  Patient ID: Jonathon Lin, male   DOB: [birth year], 68 y.o.   MRN: [id number]     09/11/2023 10/12/2023  Outreach Medication One  Initial Outreach Date (Medication One) 09/06/2023   Manufacturer Medication One [employer] [facility] Ozempic   Dose of Ozempic 8mg /35ml   Type of Radiographer, therapeutic Assistance   Date Application Sent to Patient 09/13/2023   Application Items Requested Application;Proof of Income;Other   Date Application Sent to Prescriber 09/13/2023   Name of Prescriber Carylon Perches   Date Application Received From Patient  10/09/2023  Application Items Received From Patient  Application;Proof of Income;Other  Date Application Received From Provider  09/13/2023  Date Application Submitted to Manufacturer  10/11/2023  Method Application Sent to Manufacturer  Fax    Pattricia Boss, CPhT [place]  Office: [phone] Fax: [phone] Email: [email or web address]

## 2023-10-18 ENCOUNTER — Telehealth: Payer: Self-pay | Admitting: Pharmacy Technician

## 2023-10-18 DIAGNOSIS — Z5986 Financial insecurity: Secondary | ICD-10-CM

## 2023-10-18 NOTE — Progress Notes (Signed)
 Pharmacy Medication Assistance Program Note    10/18/2023  Patient ID: Jonathon Lin, male   DOB: 1956-02-19, 68 y.o.   MRN: [id number]     09/11/2023 10/12/2023 10/18/2023  Outreach Medication One  Initial Outreach Date (Medication One) 09/06/2023    Manufacturer Medication One [employer] [facility] Ozempic    Dose of Ozempic 8mg /41ml    Type of Radiographer, therapeutic Assistance    Date Application Sent to Patient 09/13/2023    Application Items Requested Application;Proof of Income;Other    Date Application Sent to Prescriber 09/13/2023    Name of Prescriber Carylon Perches    Date Application Received From Patient  10/09/2023   Application Items Received From Patient  Application;Proof of Income;Other   Date Application Received From Provider  09/13/2023   Date Application Submitted to Manufacturer  10/11/2023   Method Application Sent to Manufacturer  Fax   Patient Assistance Determination   Approved  Approval Start Date   10/12/2023  Approval End Date   10/06/2024  Patient Notification Method   Telephone Call  Telephone Call Outcome   Successful    Care coordination call made to Novo Nordisk in regard to Ozempic application.  Per IVR system, patient is APPROVED 10/12/23-10/06/24. Initial medication shipment and subsequent refill shipments will process automatically and be delivered to the prescriber's office. Patient may call Novo Nordisk at any time to check on next shipments by calling [phone].  Successful outreach to patient. HIPAA verified. Patient was informed of his approval. refill procedure and when to expect shipment. Patient verbalized understanding.  Will close case as patient assistance is completed and will send in basket message to [facility] PharmD Fayette Pho notifying him of this information.  Pattricia Boss, CPhT [place]  Office: [phone] Fax: [phone] Email: [email or web address]

## 2023-11-02 ENCOUNTER — Telehealth: Payer: Self-pay

## 2023-11-02 NOTE — Progress Notes (Signed)
   11/02/2023  Patient ID: Jonathon Lin, male   DOB: Oct 27, 1955, 68 y.o.   MRN: [id number]   Patient appeared on insurance report for not passing the quality metrics in 2024:  Medication Adherence for Cholesterol (MAC)   Outreach to the patient was Successful. Spoke with [name], he states he just picked up simvastatin from [place] a week ago. Called [facility] to confirm this. Now gets Ozempic from [facility], should receive first shipment in the next week or so.   Meds tracking:  -Metformin 1000 mg - Last filled 90DS on 08/15/23, no fills this year. Next due 11/13/23. -Glimepiride 2 mg - Last filled 90DS on 09/27/23, next fill 01/13/24. -Pioglitazone 15 mg - Last filled 30DS on 10/14/23, first fill 09/13/23, qualifies for metric. Next fill due 11/13/23.  -Simvastatin 20 mg - Last filled 90DS on 10/27/23. Next fill due 01/25/24  Plan:  Talked with him about diabetes and lipid control, last A1C was 11.8 and last LDL was 130 on 08/21/23. Appears to have been non-adherent at the time. Spoke with [facility] about changing all fills to 90 days for adherence purposes. He is up to date on all other meds. Follow up with labs on 11/29/23. I plan to meet with him 11/14/23 to set him up with CGM and help manage diabetes. Will continue monitoring adherence while following.   Fayette Pho, PharmD

## 2023-11-14 ENCOUNTER — Telehealth: Payer: Self-pay

## 2023-11-14 NOTE — Progress Notes (Signed)
    11/14/2023 Name: Jonathon Lin MRN: [id number] DOB: July 14, 1956   Jonathon Lin is a 68 y.o. year old male who was referred for medication management by their primary care provider, Carylon Perches, MD. They presented for a face to face visit today.   They were referred to the pharmacist by a quality report for assistance in managing diabetes and hyperlipidemia    Subjective:  Care Team: Primary Care Provider: Carylon Perches, MD ; Next Scheduled Visit: 11/29/23  Medication Access/Adherence  Current Pharmacy:  [facility] [address] [place] - [address] [place] [address] Phone: [phone] Fax: [phone]   Patient reports affordability concerns with their medications: No  Patient reports access/transportation concerns to their pharmacy: No  Patient reports adherence concerns with their medications:  Yes   Diabetes:  Current glucose readings: 260 in the morning about 2 weeks ago. Very rarely tests blood sugar.  Patient denies hypoglycemic s/sx including dizziness, shakiness, sweating. Patient denies hyperglycemic symptoms including polyuria, polydipsia, polyphagia, nocturia, neuropathy, blurred vision.  Eats about one meal a day at 3-4 pm, lives alone and finds it difficult to cook meals for himself. Coffee in the morning, 1-2 drinks of vodka occasionally at night. Will assess diet/lifestyle more thoroughly next visit.  Hyperlipidemia/ASCVD Risk Reduction  Has not been adherent to simvastatin, admits to rarely taking it prior to labs in January. Reports he's been much more adherent lately. Still estimates he only takes simvastatin 4 times a week on average. Night time dosing is difficult for him, he tends to forget. Tolerates simvastatin well. His 10 year risk of ASCVD: 33.1%, no personal CVD event history but has family history of MI and CHF per records.   Objective:  BP 122/81 on 08/29/23 Wt 283 lbs on 08/29/23 A1C 11.8 on 08/23/23 GFR 78 w/ MACR  22 on 08/23/23 TC 226, Trig 281, HDL 46, LDL 130 on 08/23/23   Assessment/Plan:   Diabetes: - Currently uncontrolled - Reviewed long term cardiovascular and renal outcomes of uncontrolled blood sugar - Reviewed goal A1c, goal fasting, and goal 2 hour post prandial glucose - Set up Freestyle Libre 3 Plus sensor on his right arm today and connected to his iPhone. Linked his LibreView account to our office account to assist in monitoring.  - Plan to review [employer] data with him in 2 weeks and order sensors at his pharmacy if he would like to continue use.  - Will discuss diet/lifestyle modifications in more detail at next visit.  Hyperlipidemia/ASCVD Risk Reduction: - Currently uncontrolled.  - Reviewed long term complications of uncontrolled cholesterol - Discussed alternative statin regimens, may prefer rosuvastatin or atorvastatin due to higher intensity and longer action. Wouldn't require nighttime dosing which seems to be his adherence barrier. - Since he reports better adherence we can wait until labs are updated in 2 weeks and make a change if necessary at his next visit on 11/29/23.   Fayette Pho, PharmD

## 2023-11-22 DIAGNOSIS — E1129 Type 2 diabetes mellitus with other diabetic kidney complication: Secondary | ICD-10-CM | POA: Diagnosis not present

## 2023-11-28 ENCOUNTER — Telehealth: Payer: Self-pay

## 2023-11-28 NOTE — Progress Notes (Signed)
    11/28/2023 Name: Jonathon Lin MRN: [id number] DOB: 1956-05-16   Jonathon Lin is a 68 y.o. year old male who was referred for medication management by their primary care provider, Carylon Perches, MD. They presented for a face to face visit today.   They were referred to the pharmacist by a quality report for assistance in managing diabetes and hyperlipidemia    Diabetes:  Using Freestyle Libre 3 Plus Sensors w/ iPhone; viewing 30-40 times daily  Date of Download: 11/28/23 % Time CGM is active: 91% Average Glucose: 160 mg/dL Glucose Management Indicator: 7.1%  Glucose Variability: 20.2% (goal <36%) Time in Goal:  - Time in range 70-180: 79% - Time above range: 21% - Time below range: 0% Observed patterns: Stable pattern with very view peaks and valleys, has a typical spike in the afternoon when he eats his 1 meal of the day.   Still eating 1 meal per day with snacks throughout the day. Had ice cream from [employer] once which accounted for his only "very high" reading on 11/22/23. Has been eating steak and sweet potatoes or a variety of protein and vegetables for his afternoon meal. Snacking on nuts more often.  Increased his physical activity over the last two weeks. He walked about 1 mile every day except the days it was raining. Finds walking boring but we discussed things to make it more entertaining for him.  Hyperlipidemia/ASCVD Risk Reduction  His 10 year risk of ASCVD: 33.1%, no personal CVD event history but has family history of MI and CHF per records.   Reports he's been much more adherent to simvastatin lately. Only missed once in the last two weeks, tolerates simvastatin well. Did not get updated lipid panel with his most recent labs on 11/23/23  Objective:  BP 122/81 on 08/29/23 Wt 283 lbs on 08/29/23 A1C 9.2 on 11/23/23 GFR 78 w/ MACR 22 on 08/23/23 TC 226, Trig 281, HDL 46, LDL 130 on 08/23/23   Assessment/Plan:   Diabetes: A1C down from baseline of 11.8 to 9.2 on  11/23/23 Goal A1C <8% in 3 months, and <7% in 6 months  Currently uncontrolled but improving  -Ozempic 2 mg - once weekly (PAP) -Glimepiride 2 mg - once daily -Metformin 1000 mg - twice daily -Pioglitazone 15 mg - once daily  - Reviewed goal A1c, goal fasting, and goal 2 hour post prandial glucose - Reviewed CGM data and discussed trends, glucose control significantly improved the last two weeks - Placed additional Freestyle Libre 3 Plus sensor on his right arm today and connected to his iPhone.  - Discussed diet/lifestyle modifications, he has good understanding of what impacts his glucose and how to manage this - Provided handouts on portion sizes, food swaps, and how to read nutrition labels. - Sent in [employer] 3 Plus sensors to pharmacy, will collaborate with them to determine coverage  Hyperlipidemia/ASCVD Risk Reduction: - Currently uncontrolled.  - Reviewed long term complications of uncontrolled cholesterol - Discussed alternative statin regimens, may prefer rosuvastatin or atorvastatin due to higher intensity and longer action but do not have updated labs to determine necessity - Since he reports better adherence I will follow up with him regularly to help him maintain adherence and collaborate with PCP to order lipid panel at next visit.   Fayette Pho, PharmD

## 2023-11-29 DIAGNOSIS — I10 Essential (primary) hypertension: Secondary | ICD-10-CM | POA: Diagnosis not present

## 2023-11-29 DIAGNOSIS — E1129 Type 2 diabetes mellitus with other diabetic kidney complication: Secondary | ICD-10-CM | POA: Diagnosis not present

## 2024-01-30 ENCOUNTER — Encounter: Payer: Self-pay | Admitting: Podiatry

## 2024-01-30 ENCOUNTER — Ambulatory Visit (INDEPENDENT_AMBULATORY_CARE_PROVIDER_SITE_OTHER): Admitting: Podiatry

## 2024-01-30 DIAGNOSIS — M79674 Pain in right toe(s): Secondary | ICD-10-CM | POA: Diagnosis not present

## 2024-01-30 DIAGNOSIS — S90212A Contusion of left great toe with damage to nail, initial encounter: Secondary | ICD-10-CM

## 2024-01-30 DIAGNOSIS — M79675 Pain in left toe(s): Secondary | ICD-10-CM | POA: Diagnosis not present

## 2024-01-30 DIAGNOSIS — E1151 Type 2 diabetes mellitus with diabetic peripheral angiopathy without gangrene: Secondary | ICD-10-CM

## 2024-01-30 DIAGNOSIS — B351 Tinea unguium: Secondary | ICD-10-CM

## 2024-01-30 NOTE — Progress Notes (Signed)
    Subjective:  Patient ID: Jonathon Lin, male    DOB: December 02, 1955,  MRN: [id number]  Jonathon Lin presents to clinic today for:  Chief Complaint  Patient presents with   [facility]    Rm20 DFC/diabetic/A1C8.1/Blood sugar 149   Patient notes nails are thick and elongated, causing pain in shoe gear when ambulating.    PCP is Artemisa Bile, MD. last seen 11/29/2023  Past Medical History:  Diagnosis Date   Diabetes mellitus without complication (HCC)    High cholesterol    Hypertension    Sleep apnea    Allergies  Allergen Reactions   Amoxicillin-Pot Clavulanate Itching    REACTION: hives   Morphine And Codeine Other (See Comments)    hypertension   Sildenafil     REACTION: headaches    Objective:  Jonathon Lin is a pleasant 68 y.o. male in NAD. AAO x 3.  Vascular Examination: Patient has palpable DP pulse, absent PT pulse bilateral.  Delayed capillary refill bilateral toes.  Sparse digital hair bilateral.  Proximal to distal cooling WNL bilateral.    Dermatological Examination: Interspaces are clear with no open lesions noted bilateral.  Skin is shiny and atrophic bilateral.  Nails are 3-31mm thick, with yellowish/brown discoloration, subungual debris and distal onycholysis x10.  There is pain with compression of nails x10.  There is a subungual hematoma noted in the left great toenail.  The nail is not lytic.  The hematoma does not take up the entirety of the toenail.  There is no pain with compression.  Patient qualifies for at-risk foot care because of diabetes with PVD.  Assessment/Plan: 1. Pain due to onychomycosis of toenails of both feet   2. Type II diabetes mellitus with peripheral circulatory disorder (HCC)   3. Subungual hematoma of great toe of left foot, initial encounter     Mycotic nails x10 were sharply debrided with sterile nail nippers and power debriding burr to decrease bulk and length.  The left hallux nail was debrided to the level of the subungual hematoma.   Active blood drainage was noted and expressed.  Antibiotic ointment and a Band-Aid were applied.  Informed the patient that as he walks today more blood will exude through the nail plate.  At the end of the day today he can remove the Band-Aid and should not need any further care.  He may perform an Epsom salt soak this evening if he would like.  This may help draw out any remaining drainage.  Return in about 3 months (around 05/01/2024) for [facility].   Joe Murders, DPM, FACFAS [facility]     [address] [place], [place] [address]                Office [phone]  Fax [phone]

## 2024-02-29 DIAGNOSIS — Z79899 Other long term (current) drug therapy: Secondary | ICD-10-CM | POA: Diagnosis not present

## 2024-02-29 DIAGNOSIS — E1129 Type 2 diabetes mellitus with other diabetic kidney complication: Secondary | ICD-10-CM | POA: Diagnosis not present

## 2024-02-29 DIAGNOSIS — E785 Hyperlipidemia, unspecified: Secondary | ICD-10-CM | POA: Diagnosis not present

## 2024-03-06 DIAGNOSIS — E785 Hyperlipidemia, unspecified: Secondary | ICD-10-CM | POA: Diagnosis not present

## 2024-03-06 DIAGNOSIS — E1129 Type 2 diabetes mellitus with other diabetic kidney complication: Secondary | ICD-10-CM | POA: Diagnosis not present

## 2024-03-06 DIAGNOSIS — I10 Essential (primary) hypertension: Secondary | ICD-10-CM | POA: Diagnosis not present

## 2024-03-06 DIAGNOSIS — R739 Hyperglycemia, unspecified: Secondary | ICD-10-CM | POA: Diagnosis not present

## 2024-04-30 ENCOUNTER — Encounter: Payer: Self-pay | Admitting: Podiatry

## 2024-04-30 ENCOUNTER — Ambulatory Visit (INDEPENDENT_AMBULATORY_CARE_PROVIDER_SITE_OTHER): Admitting: Podiatry

## 2024-04-30 DIAGNOSIS — M79675 Pain in left toe(s): Secondary | ICD-10-CM

## 2024-04-30 DIAGNOSIS — M79674 Pain in right toe(s): Secondary | ICD-10-CM

## 2024-04-30 DIAGNOSIS — B351 Tinea unguium: Secondary | ICD-10-CM

## 2024-04-30 DIAGNOSIS — E1151 Type 2 diabetes mellitus with diabetic peripheral angiopathy without gangrene: Secondary | ICD-10-CM | POA: Diagnosis not present

## 2024-04-30 DIAGNOSIS — L84 Corns and callosities: Secondary | ICD-10-CM | POA: Diagnosis not present

## 2024-04-30 NOTE — Progress Notes (Unsigned)
    Subjective:  Patient ID: Jonathon Lin, male    DOB: 07-30-1956,  MRN: [id number]  Jonathon Lin presents to clinic today for:  Chief Complaint  Patient presents with   Diabetes    [facility] Toenail trim. A1C 7.1 0 pain.    Patient notes nails are thick and elongated, causing pain in shoe gear when ambulating.  He has painful preulcerative lesions on the right third toe and left submet 5.  PCP is Sheryle Carwin, MD. last seen 11/29/2023  Past Medical History:  Diagnosis Date   Diabetes mellitus without complication (HCC)    High cholesterol    Hypertension    Sleep apnea    Allergies  Allergen Reactions   Amoxicillin-Pot Clavulanate Itching    REACTION: hives   Morphine And Codeine Other (See Comments)    hypertension   Sildenafil     REACTION: headaches    Objective:  Jonathon Lin is a pleasant 68 y.o. male in NAD. AAO x 3.  Vascular Examination: Patient has palpable DP pulse, absent PT pulse bilateral.  Delayed capillary refill bilateral toes.  Sparse digital hair bilateral.  Proximal to distal cooling WNL bilateral.    Dermatological Examination: Interspaces are clear with no open lesions noted bilateral.  Skin is shiny and atrophic bilateral.  Nails are 3-74mm thick, with yellowish/brown discoloration, subungual debris and distal onycholysis x10.  There is pain with compression of nails x10.  There are hyperkeratotic lesions noted on the dorsal right third toe PIPJ and left submetatarsal head 5.  Patient qualifies for at-risk foot care because of diabetes with PVD.  Assessment/Plan: 1. Pain due to onychomycosis of toenails of both feet   2. Pre-ulcerative corn or callous   3. Type II diabetes mellitus with peripheral circulatory disorder (HCC)    Mycotic nails x10 were sharply debrided with sterile nail nippers and power debriding burr to decrease bulk and length.  Hyperkeratotic lesions x 2 were shaved with #312 blade at the locations listed above in the dermatological  examination.  Return in about 3 months (around 07/30/2024) for [facility].   Awanda CHARM Imperial, DPM, FACFAS [facility]     [address] [place], [place] [address]                Office [phone]  Fax [phone]

## 2024-06-07 DIAGNOSIS — Z79899 Other long term (current) drug therapy: Secondary | ICD-10-CM | POA: Diagnosis not present

## 2024-06-07 DIAGNOSIS — E1129 Type 2 diabetes mellitus with other diabetic kidney complication: Secondary | ICD-10-CM | POA: Diagnosis not present

## 2024-06-12 DIAGNOSIS — E1129 Type 2 diabetes mellitus with other diabetic kidney complication: Secondary | ICD-10-CM | POA: Diagnosis not present

## 2024-06-12 DIAGNOSIS — Z23 Encounter for immunization: Secondary | ICD-10-CM | POA: Diagnosis not present

## 2024-06-12 DIAGNOSIS — I10 Essential (primary) hypertension: Secondary | ICD-10-CM | POA: Diagnosis not present

## 2024-07-30 ENCOUNTER — Ambulatory Visit: Admitting: Podiatry

## 2024-08-14 ENCOUNTER — Encounter: Payer: Self-pay | Admitting: Podiatry

## 2024-08-14 ENCOUNTER — Ambulatory Visit (INDEPENDENT_AMBULATORY_CARE_PROVIDER_SITE_OTHER): Admitting: Podiatry

## 2024-08-14 VITALS — Ht 74.0 in | Wt 308.6 lb

## 2024-08-14 DIAGNOSIS — B351 Tinea unguium: Secondary | ICD-10-CM | POA: Diagnosis not present

## 2024-08-14 DIAGNOSIS — M79674 Pain in right toe(s): Secondary | ICD-10-CM | POA: Diagnosis not present

## 2024-08-14 DIAGNOSIS — M79675 Pain in left toe(s): Secondary | ICD-10-CM | POA: Diagnosis not present

## 2024-08-14 DIAGNOSIS — E1151 Type 2 diabetes mellitus with diabetic peripheral angiopathy without gangrene: Secondary | ICD-10-CM

## 2024-08-14 NOTE — Progress Notes (Signed)

## 2024-09-11 ENCOUNTER — Other Ambulatory Visit (HOSPITAL_COMMUNITY): Payer: Self-pay | Admitting: Internal Medicine

## 2024-09-11 ENCOUNTER — Ambulatory Visit (HOSPITAL_COMMUNITY)
Admission: RE | Admit: 2024-09-11 | Discharge: 2024-09-11 | Disposition: A | Discharge status: Home / Self Care | Source: Ambulatory Visit | Attending: Internal Medicine | Admitting: Internal Medicine

## 2024-09-11 DIAGNOSIS — R9341 Abnormal radiologic findings on diagnostic imaging of renal pelvis, ureter, or bladder: Secondary | ICD-10-CM | POA: Diagnosis not present

## 2024-09-11 DIAGNOSIS — R339 Retention of urine, unspecified: Secondary | ICD-10-CM | POA: Insufficient documentation

## 2024-09-11 DIAGNOSIS — K760 Fatty (change of) liver, not elsewhere classified: Secondary | ICD-10-CM | POA: Insufficient documentation

## 2024-09-11 DIAGNOSIS — K8020 Calculus of gallbladder without cholecystitis without obstruction: Secondary | ICD-10-CM | POA: Diagnosis not present

## 2024-09-14 NOTE — Progress Notes (Unsigned)
" ° ° °  Chief Complaint: Bladder not emptying  History of Present Illness:     Past Medical History:  Past Medical History:  Diagnosis Date   Diabetes mellitus without complication (HCC)    High cholesterol    Hypertension    Sleep apnea     Past Surgical History:  Past Surgical History:  Procedure Laterality Date   HAND SURGERY      Allergies:  Allergies[1]  Family History:  No family history on file.  Social History:  Social History[2]  Review of symptoms:  Constitutional:  Negative for unexplained weight loss, night sweats, fever, chills ENT:  Negative for nose bleeds, sinus pain, painful swallowing CV:  Negative for chest pain, shortness of breath, exercise intolerance, palpitations, loss of consciousness Resp:  Negative for cough, wheezing, shortness of breath GI:  Negative for nausea, vomiting, diarrhea, bloody stools GU:  Positives noted in HPI; otherwise negative for gross hematuria, dysuria, urinary incontinence Neuro:  Negative for seizures, poor balance, limb weakness, slurred speech Psych:  Negative for lack of energy, depression, anxiety Endocrine:  Negative for polydipsia, polyuria, symptoms of hypoglycemia (dizziness, hunger, sweating) Hematologic:  Negative for anemia, purpura, petechia, prolonged or excessive bleeding, use of anticoagulants  Allergic:  Negative for difficulty breathing or choking as a result of exposure to anything; no shellfish allergy; no allergic response (rash/itch) to materials, foods  Physical exam: There were no vitals taken for this visit. GENERAL APPEARANCE:  Well appearing, well developed, well nourished, NAD HEENT: Atraumatic, Normocephalic. NECK: Normal appearance LUNGS: Normal inspiratory and expiratory excursion HEART: Regular Rate ABDOMEN: ***. GU: Phallus normal, no lesions. Scrotal skin normal. Testicles/epididymal structures normal. Meatus normal. Normal anal sphincter tone, prostate ***mL, symmetric, non nodular,  non tender. EXTREMITIES: Moves all extremities well.  Without clubbing, cyanosis, or edema. NEUROLOGIC:  Alert and oriented x 3, normal gait, CN II-XII grossly intact.  MENTAL STATUS:  Appropriate. SKIN:  Warm, dry and intact.    Results: No results found for this or any previous visit (from the past 24 hours).  I have reviewed referring/prior physicians notes  I have reviewed urinalysis  I have reviewed PSA results  I have reviewed prior imaging  I have reviewed urine culture results  Assessment: ***   Plan: ***     [1]  Allergies Allergen Reactions   Amoxicillin-Pot Clavulanate Itching    REACTION: hives   Morphine And Codeine Other (See Comments)    hypertension   Sildenafil     REACTION: headaches  [2]  Social History Tobacco Use   Smoking status: Former    Passive exposure: Past   Smokeless tobacco: Never   Tobacco comments:    QUIT 8 YEARS AGO  Vaping Use   Vaping status: Never Used  Substance Use Topics   Alcohol use: Yes    Alcohol/week: 98.0 standard drinks of alcohol    Types: 98 Cans of beer per week    Comment: 14  beers daily   Drug use: No   "

## 2024-09-16 ENCOUNTER — Ambulatory Visit: Admitting: Urology

## 2024-09-16 DIAGNOSIS — R339 Retention of urine, unspecified: Secondary | ICD-10-CM

## 2024-09-19 ENCOUNTER — Other Ambulatory Visit (HOSPITAL_COMMUNITY): Payer: Self-pay | Admitting: Internal Medicine

## 2024-09-19 DIAGNOSIS — R221 Localized swelling, mass and lump, neck: Secondary | ICD-10-CM

## 2024-09-26 ENCOUNTER — Ambulatory Visit: Admitting: Urology

## 2024-11-12 ENCOUNTER — Ambulatory Visit: Admitting: Podiatry
